# Patient Record
Sex: Female | Born: 1970 | Race: White | Hispanic: No | Smoking: Former smoker
Health system: Southern US, Community
[De-identification: ages and names within clinical notes are randomized; demographics above are authoritative.]

## PROBLEM LIST (undated history)

## (undated) DIAGNOSIS — F32A Depression, unspecified: Secondary | ICD-10-CM

## (undated) DIAGNOSIS — R87620 Atypical squamous cells of undetermined significance on cytologic smear of vagina (ASC-US): Secondary | ICD-10-CM

## (undated) DIAGNOSIS — F329 Major depressive disorder, single episode, unspecified: Secondary | ICD-10-CM

## (undated) DIAGNOSIS — E785 Hyperlipidemia, unspecified: Secondary | ICD-10-CM

## (undated) HISTORY — PX: APPENDECTOMY: SHX54

## (undated) HISTORY — DX: Atypical squamous cells of undetermined significance on cytologic smear of vagina (ASC-US): R87.620

## (undated) HISTORY — DX: Hyperlipidemia, unspecified: E78.5

---
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
## 2001-04-25 ENCOUNTER — Encounter: Payer: Self-pay | Admitting: Internal Medicine

## 2001-04-25 ENCOUNTER — Ambulatory Visit (HOSPITAL_COMMUNITY): Admission: RE | Admit: 2001-04-25 | Discharge: 2001-04-25 | Payer: Self-pay | Admitting: Internal Medicine

## 2010-02-21 ENCOUNTER — Ambulatory Visit (HOSPITAL_COMMUNITY): Admission: RE | Admit: 2010-02-21 | Discharge: 2010-02-21 | Payer: Self-pay | Admitting: Family Medicine

## 2010-02-28 ENCOUNTER — Encounter (INDEPENDENT_AMBULATORY_CARE_PROVIDER_SITE_OTHER): Payer: Self-pay | Admitting: General Surgery

## 2010-02-28 ENCOUNTER — Ambulatory Visit (HOSPITAL_COMMUNITY)
Admission: RE | Admit: 2010-02-28 | Discharge: 2010-03-02 | Payer: Self-pay | Source: Home / Self Care | Admitting: General Surgery

## 2010-06-24 LAB — DIFFERENTIAL
Basophils Absolute: 0 10*3/uL (ref 0.0–0.1)
Basophils Relative: 0 % (ref 0–1)
Eosinophils Absolute: 0.1 10*3/uL (ref 0.0–0.7)
Eosinophils Relative: 1 % (ref 0–5)
Lymphocytes Relative: 22 % (ref 12–46)
Lymphs Abs: 1.8 10*3/uL (ref 0.7–4.0)
Monocytes Absolute: 0.5 10*3/uL (ref 0.1–1.0)
Monocytes Relative: 6 % (ref 3–12)
Neutro Abs: 6 10*3/uL (ref 1.7–7.7)
Neutrophils Relative %: 72 % (ref 43–77)

## 2010-06-24 LAB — COMPREHENSIVE METABOLIC PANEL
ALT: 16 U/L (ref 0–35)
AST: 19 U/L (ref 0–37)
Albumin: 3.6 g/dL (ref 3.5–5.2)
Alkaline Phosphatase: 55 U/L (ref 39–117)
BUN: 10 mg/dL (ref 6–23)
CO2: 27 mEq/L (ref 19–32)
Calcium: 9.1 mg/dL (ref 8.4–10.5)
Chloride: 104 mEq/L (ref 96–112)
Creatinine, Ser: 0.75 mg/dL (ref 0.4–1.2)
GFR calc Af Amer: >60 mL/min (ref >60)
GFR calc non Af Amer: >60 mL/min (ref >60)
Glucose, Bld: 102 mg/dL — ABNORMAL HIGH (ref 70–99)
Potassium: 3.7 mEq/L (ref 3.5–5.1)
Sodium: 136 mEq/L (ref 135–145)
Total Bilirubin: 0.5 mg/dL (ref 0.3–1.2)
Total Protein: 6.4 g/dL (ref 6.0–8.3)

## 2010-06-24 LAB — CBC
HCT: 34.4 % — ABNORMAL LOW (ref 36.0–46.0)
Hemoglobin: 11.7 g/dL — ABNORMAL LOW (ref 12.0–15.0)
MCH: 29.1 pg (ref 26.0–34.0)
MCHC: 34.1 g/dL (ref 30.0–36.0)
MCV: 85.6 fL (ref 78.0–100.0)
Platelets: 308 10*3/uL (ref 150–400)
RBC: 4.02 MIL/uL (ref 3.87–5.11)
RDW: 12.9 % (ref 11.5–15.5)
WBC: 8.4 10*3/uL (ref 4.0–10.5)

## 2010-06-24 LAB — SURGICAL PCR SCREEN
MRSA, PCR: NEGATIVE
Staphylococcus aureus: NEGATIVE

## 2010-06-24 LAB — HCG, QUANTITATIVE, PREGNANCY: hCG, Beta Chain, Quant, S: <2 m[IU]/mL (ref <5)

## 2011-01-21 ENCOUNTER — Other Ambulatory Visit (HOSPITAL_COMMUNITY): Payer: Self-pay | Admitting: Family Medicine

## 2011-01-21 DIAGNOSIS — Z139 Encounter for screening, unspecified: Secondary | ICD-10-CM

## 2011-02-23 ENCOUNTER — Ambulatory Visit (HOSPITAL_COMMUNITY)
Admission: RE | Admit: 2011-02-23 | Discharge: 2011-02-23 | Disposition: A | Discharge status: Home / Self Care | Payer: BC Managed Care – PPO | Source: Ambulatory Visit | Attending: Family Medicine | Admitting: Family Medicine

## 2011-02-23 DIAGNOSIS — Z139 Encounter for screening, unspecified: Secondary | ICD-10-CM

## 2011-02-23 DIAGNOSIS — Z1231 Encounter for screening mammogram for malignant neoplasm of breast: Secondary | ICD-10-CM | POA: Insufficient documentation

## 2011-04-14 HISTORY — PX: ANAL FISTULOTOMY: SHX6423

## 2012-01-25 ENCOUNTER — Other Ambulatory Visit (HOSPITAL_COMMUNITY): Payer: Self-pay | Admitting: Family Medicine

## 2012-01-25 DIAGNOSIS — Z139 Encounter for screening, unspecified: Secondary | ICD-10-CM

## 2012-03-11 ENCOUNTER — Ambulatory Visit (HOSPITAL_COMMUNITY): Payer: BC Managed Care – PPO

## 2012-03-14 ENCOUNTER — Ambulatory Visit (HOSPITAL_COMMUNITY)
Admission: RE | Admit: 2012-03-14 | Discharge: 2012-03-14 | Disposition: A | Discharge status: Home / Self Care | Payer: BC Managed Care – PPO | Source: Ambulatory Visit | Attending: Family Medicine | Admitting: Family Medicine

## 2012-03-14 DIAGNOSIS — Z139 Encounter for screening, unspecified: Secondary | ICD-10-CM

## 2012-03-14 DIAGNOSIS — Z1231 Encounter for screening mammogram for malignant neoplasm of breast: Secondary | ICD-10-CM | POA: Insufficient documentation

## 2013-02-07 ENCOUNTER — Other Ambulatory Visit (HOSPITAL_COMMUNITY): Payer: Self-pay | Admitting: Family Medicine

## 2013-02-07 DIAGNOSIS — Z139 Encounter for screening, unspecified: Secondary | ICD-10-CM

## 2013-03-20 ENCOUNTER — Ambulatory Visit (HOSPITAL_COMMUNITY)
Admission: RE | Admit: 2013-03-20 | Discharge: 2013-03-20 | Disposition: A | Discharge status: Home / Self Care | Payer: BC Managed Care – PPO | Source: Ambulatory Visit | Attending: Family Medicine | Admitting: Family Medicine

## 2013-03-20 DIAGNOSIS — Z139 Encounter for screening, unspecified: Secondary | ICD-10-CM

## 2013-03-20 DIAGNOSIS — Z1231 Encounter for screening mammogram for malignant neoplasm of breast: Secondary | ICD-10-CM | POA: Insufficient documentation

## 2013-06-24 LAB — OB RESULTS CONSOLE HGB/HCT, BLOOD
HEMATOCRIT: 38 %
Hemoglobin: 13.4 g/dL

## 2013-06-25 LAB — OB RESULTS CONSOLE PLATELET COUNT: Platelets: 367 10*3/uL

## 2013-07-13 ENCOUNTER — Encounter: Payer: Self-pay | Admitting: *Deleted

## 2013-07-18 ENCOUNTER — Ambulatory Visit (INDEPENDENT_AMBULATORY_CARE_PROVIDER_SITE_OTHER): Payer: BC Managed Care – PPO | Admitting: Obstetrics & Gynecology

## 2013-07-18 ENCOUNTER — Other Ambulatory Visit (HOSPITAL_COMMUNITY)
Admission: RE | Admit: 2013-07-18 | Discharge: 2013-07-18 | Disposition: A | Discharge status: Home / Self Care | Payer: BC Managed Care – PPO | Source: Ambulatory Visit | Attending: Obstetrics & Gynecology | Admitting: Obstetrics & Gynecology

## 2013-07-18 ENCOUNTER — Encounter: Payer: Self-pay | Admitting: Obstetrics & Gynecology

## 2013-07-18 VITALS — BP 120/70 | Ht 62.2 in | Wt 150.0 lb

## 2013-07-18 DIAGNOSIS — Z1151 Encounter for screening for human papillomavirus (HPV): Secondary | ICD-10-CM | POA: Insufficient documentation

## 2013-07-18 DIAGNOSIS — R87619 Unspecified abnormal cytological findings in specimens from cervix uteri: Secondary | ICD-10-CM

## 2013-07-18 MED ORDER — DESOGESTREL-ETHINYL ESTRADIOL 0.15-0.02/0.01 MG (21/5) PO TABS: 1.0000 | ORAL_TABLET | Freq: Every day | ORAL | Status: DC

## 2013-07-18 NOTE — Progress Notes (Signed)
Patient ID: Desiree Chen, female   DOB: 05/03/1970, 43 y.o.   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 Hyperlipidemia   . 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 No pertinent past surgical history.  OB History   Grav Para Term Preterm Abortions TAB SAB Ect Mult Living                  Allergies  Allergen Reactions  . Sulfa Antibiotics Itching    History   Social History  . Marital Status: Legally Separated    Spouse Name: N/A    Number of Children: N/A  . Years of Education: N/A   Social History Main Topics  . Smoking status: Former Games developermoker  . Smokeless tobacco: None  . Alcohol Use: None  . Drug Use: None  . Sexual Activity: None   Other Topics Concern  . None   Social History Narrative  . None    History reviewed. No pertinent family history.

## 2013-07-24 LAB — CERVICOVAGINAL ANCILLARY ONLY: HPV: NOT DETECTED

## 2013-09-05 ENCOUNTER — Telehealth: Payer: Self-pay | Admitting: Obstetrics & Gynecology

## 2013-09-05 MED ORDER — NORGESTIMATE-ETH ESTRADIOL 0.25-35 MG-MCG PO TABS: 1.0000 | ORAL_TABLET | Freq: Every day | ORAL | Status: DC

## 2013-09-05 NOTE — Telephone Encounter (Signed)
Pt states does not like the Kariva birth control requesting Dr. Despina Hidden to change.

## 2013-10-16 ENCOUNTER — Ambulatory Visit (INDEPENDENT_AMBULATORY_CARE_PROVIDER_SITE_OTHER): Payer: BC Managed Care – PPO | Admitting: Obstetrics & Gynecology

## 2013-10-16 ENCOUNTER — Encounter: Payer: Self-pay | Admitting: Obstetrics & Gynecology

## 2013-10-16 VITALS — BP 110/80 | Ht 62.0 in | Wt 152.0 lb

## 2013-10-16 DIAGNOSIS — N920 Excessive and frequent menstruation with regular cycle: Secondary | ICD-10-CM | POA: Insufficient documentation

## 2013-10-16 MED ORDER — MEGESTROL ACETATE 40 MG PO TABS: 40.0000 mg | ORAL_TABLET | Freq: Every day | ORAL | Status: DC

## 2013-10-16 NOTE — Progress Notes (Signed)
Patient ID: Desiree Chen, female   DOB: 12/11/1970, 43 y.o.   MRN: [id number] Blood pressure 110/80, height 5\' 2"  (1.575 m), weight 152 lb (68.947 kg), last menstrual period 10/02/2013.  Pt doing poorly on her OCP Will try megestrol then do sonogram and evaluate for endometrial ablation  Follow u[p 1 month

## 2013-11-14 ENCOUNTER — Telehealth: Payer: Self-pay | Admitting: Obstetrics & Gynecology

## 2013-11-14 NOTE — Telephone Encounter (Signed)
Pt states had light vaginal bleeding starting on 11/09/13 been taking the megace TID since 11/10/13 with no improvement. Pt does have an appt 11/27/2013 for an ultrasound. Please advise.

## 2013-11-22 NOTE — Telephone Encounter (Signed)
Just keep appt

## 2013-11-24 ENCOUNTER — Ambulatory Visit: Payer: BC Managed Care – PPO | Admitting: Obstetrics & Gynecology

## 2013-11-24 ENCOUNTER — Other Ambulatory Visit: Payer: BC Managed Care – PPO

## 2013-11-27 ENCOUNTER — Ambulatory Visit (INDEPENDENT_AMBULATORY_CARE_PROVIDER_SITE_OTHER): Payer: BC Managed Care – PPO | Admitting: Obstetrics & Gynecology

## 2013-11-27 ENCOUNTER — Encounter (HOSPITAL_COMMUNITY): Payer: Self-pay | Admitting: Pharmacy Technician

## 2013-11-27 ENCOUNTER — Ambulatory Visit (INDEPENDENT_AMBULATORY_CARE_PROVIDER_SITE_OTHER): Payer: BC Managed Care – PPO

## 2013-11-27 ENCOUNTER — Encounter: Payer: Self-pay | Admitting: Radiology

## 2013-11-27 VITALS — BP 120/80 | Ht 62.2 in | Wt 151.0 lb

## 2013-11-27 DIAGNOSIS — N920 Excessive and frequent menstruation with regular cycle: Secondary | ICD-10-CM

## 2013-11-27 DIAGNOSIS — N946 Dysmenorrhea, unspecified: Secondary | ICD-10-CM

## 2013-11-27 NOTE — Progress Notes (Signed)
Patient ID: Desiree Chen, female   DOB: 04-Dec-1970, 43 y.o.   MRN: [id number] US Pelvis Complete  11/27/2013   GYNECOLOGIC SONOGRAM   Desiree Chen is a 43 y.o. [id number] LMP 11/09/2013 for a pelvic sonogram  for menorrhagia.  Uterus                      8.5 x 5.2 x 3.9 cm, anteverted   Endometrium          4.0 mm, symmetrical,   Right ovary             2.2 x 1.7 x 1.5 cm,   Left ovary                1.9 x 1.6 x 1.1 cm,   No free fluid or adnexal masses noted within the pelvis  Technician Comments:  Anteverted uterus, Endometrium=4.55mm, bilateral adnexa/ovaries appear WNL,  no free fluid or adnexal masses noted within the pelvis   Chari Manning 11/27/2013 9:32 AM   Clinical Impression and recommendations:  I have reviewed the sonogram results above, combined with the patient's  current clinical course, below are my impressions and any appropriate  recommendations for management based on the sonographic findings.  Normal gyn anatomy, no anatomical source for patient's symptoms/problems  Pius Byrom H 11/27/2013 9:59 AM   Preoperative History and Physical  Desiree Chen is a 43 y.o. [id number] with Patient's last menstrual period was 11/09/2013. admitted for a hysteroscopy uterine curettage endometrial ablation.  10 year history of heavy painful periods, not managable any longer with OCP.  Sonogram is normal Responded to megace with lighter period.  Will use low dose OCP for BCM and hormonal control  PMH:    Past Medical History  Diagnosis Date  . Hyperlipidemia   . 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 No pertinent past surgical history.  POb/GynH:      OB History   Grav Para Term Preterm Abortions TAB SAB Ect Mult Living   3 2 2  0 1 0 1 0 0 2      SH:   History  Substance Use Topics  . Smoking status: Former Games developer  . Smokeless tobacco: Not on file  . Alcohol Use: Not on file    FH:    Family History  Problem Relation Age of Onset  . Cancer Father    . Stroke Mother      Allergies:  Allergies  Allergen Reactions  . Sulfa Antibiotics Itching    Medications:      Current outpatient prescriptions:azithromycin (ZITHROMAX) 1 G powder, Take 1 g by mouth once., Disp: , Rfl: ;  desogestrel-ethinyl estradiol (KARIVA) 0.15-0.02/0.01 MG (21/5) tablet, Take 1 tablet by mouth daily., Disp: 1 Package, Rfl: 11;  megestrol (MEGACE) 40 MG tablet, Take 1 tablet (40 mg total) by mouth daily., Disp: 30 tablet, Rfl: 11;  Norethindrone-Mestranol (ORTHO-NOVUM 1/50, 28, PO), Take 1 tablet by mouth daily., Disp: , Rfl:  norgestimate-ethinyl estradiol (ORTHO-CYCLEN, 28,) 0.25-35 MG-MCG tablet, Take 1 tablet by mouth daily., Disp: 1 Package, Rfl: 11  Review of Systems:   Review of Systems  Constitutional: Negative for fever, chills, weight loss, malaise/fatigue and diaphoresis.  HENT: Negative for hearing loss, ear pain, nosebleeds, congestion, sore throat, neck pain, tinnitus and ear discharge.   Eyes: Negative for blurred vision, double vision, photophobia, pain, discharge and redness.  Respiratory: Negative for cough, hemoptysis, sputum  production, shortness of breath, wheezing and stridor.   Cardiovascular: Negative for chest pain, palpitations, orthopnea, claudication, leg swelling and PND.  Gastrointestinal: Positive for abdominal pain. Negative for heartburn, nausea, vomiting, diarrhea, constipation, blood in stool and melena.  Genitourinary: Negative for dysuria, urgency, frequency, hematuria and flank pain.  Musculoskeletal: Negative for myalgias, back pain, joint pain and falls.  Skin: Negative for itching and rash.  Neurological: Negative for dizziness, tingling, tremors, sensory change, speech change, focal weakness, seizures, loss of consciousness, weakness and headaches.  Endo/Heme/Allergies: Negative for environmental allergies and polydipsia. Does not bruise/bleed easily.  Psychiatric/Behavioral: Negative for depression, suicidal ideas,  hallucinations, memory loss and substance abuse. The patient is not nervous/anxious and does not have insomnia.      PHYSICAL EXAM:  Blood pressure 120/80, height 5' 2.2" (1.58 m), weight 151 lb (68.493 kg), last menstrual period 11/09/2013.    Vitals reviewed. Constitutional: She is oriented to person, place, and time. She appears well-developed and well-nourished.  HENT:  Head: Normocephalic and atraumatic.  Right Ear: External ear normal.  Left Ear: External ear normal.  Nose: Nose normal.  Mouth/Throat: Oropharynx is clear and moist.  Eyes: Conjunctivae and EOM are normal. Pupils are equal, round, and reactive to light. Right eye exhibits no discharge. Left eye exhibits no discharge. No scleral icterus.  Neck: Normal range of motion. Neck supple. No tracheal deviation present. No thyromegaly present.  Cardiovascular: Normal rate, regular rhythm, normal heart sounds and intact distal pulses.  Exam reveals no gallop and no friction rub.   No murmur heard. Respiratory: Effort normal and breath sounds normal. No respiratory distress. She has no wheezes. She has no rales. She exhibits no tenderness.  GI: Soft. Bowel sounds are normal. She exhibits no distension and no mass. There is tenderness. There is no rebound and no guarding.  Genitourinary:       Vulva is normal without lesions Vagina is pink moist without discharge Cervix normal in appearance and pap is normal Uterus is normal Adnexa is negative with normal sized ovaries by sonogram  Musculoskeletal: Normal range of motion. She exhibits no edema and no tenderness.  Neurological: She is alert and oriented to person, place, and time. She has normal reflexes. She displays normal reflexes. No cranial nerve deficit. She exhibits normal muscle tone. Coordination normal.  Skin: Skin is warm and dry. No rash noted. No erythema. No pallor.  Psychiatric: She has a normal mood and affect. Her behavior is normal. Judgment and thought  content normal.    Labs: No results found for this or any previous visit (from the past 336 hour(s)).  EKG: No orders found for this or any previous visit.  Imaging Studies: US Pelvis Complete  11/27/2013   GYNECOLOGIC SONOGRAM   Desiree Chen is a 43 y.o. [id number] LMP 11/09/2013 for a pelvic sonogram  for menorrhagia.  Uterus                      8.5 x 5.2 x 3.9 cm, anteverted   Endometrium          4.0 mm, symmetrical,   Right ovary             2.2 x 1.7 x 1.5 cm,   Left ovary                1.9 x 1.6 x 1.1 cm,   No free fluid or adnexal masses noted within the pelvis  Technician Comments:  Anteverted uterus,  Endometrium=4.770mm, bilateral adnexa/ovaries appear WNL,  no free fluid or adnexal masses noted within the pelvis   Chari ManningMcBride, Tasha 11/27/2013 9:32 AM   Clinical Impression and recommendations:  I have reviewed the sonogram results above, combined with the patient's  current clinical course, below are my impressions and any appropriate  recommendations for management based on the sonographic findings.  Normal gyn anatomy, no anatomical source for patient's symptoms/problems  Vern Guerette H 11/27/2013 9:59 AM      Assessment: Patient Active Problem List   Diagnosis Date Noted  . Menorrhagia 10/16/2013    Plan: Hysterosocopy uterine curettage endo ablation  Shalon Salado H 11/27/2013 10:15 AM

## 2013-12-07 NOTE — Patient Instructions (Signed)
Your procedure is scheduled on: 12/14/2013  Report to [facility] at   11:00  AM.  Call this number if you have problems the morning of surgery: [phone]   Remember:   Do not drink or eat food:After Midnight.  :     Do not wear jewelry, make-up or nail polish.  Do not wear lotions, powders, or perfumes. You may wear deodorant.  Do not shave 48 hours prior to surgery. Men may shave face and neck.  Do not bring valuables to the hospital.  Contacts, dentures or bridgework may not be worn into surgery.  Leave suitcase in the car. After surgery it may be brought to your room.  For patients admitted to the hospital, checkout time is 11:00 AM the day of discharge.   Patients discharged the day of surgery will not be allowed to drive home.    Special Instructions: Shower using CHG night before surgery and shower the day of surgery use CHG.  Use special wash - you have one bottle of CHG for all showers.  You should use approximately 1/2 of the bottle for each shower.   Please read over the following fact sheets that you were given: Pain Booklet, MRSA Information, Surgical Site Infection Prevention and Care and Recovery After Surgery  Endometrial Ablation Endometrial ablation removes the lining of the uterus (endometrium). It is usually a same-day, outpatient treatment. Ablation helps avoid major surgery, such as surgery to remove the cervix and uterus (hysterectomy). After endometrial ablation, you will have little or no menstrual bleeding and may not be able to have children. However, if you are premenopausal, you will need to use a reliable method of birth control following the procedure because of the small chance that pregnancy can occur. There are different reasons to have this procedure, which include:  Heavy periods.  Bleeding that is causing anemia.  Irregular bleeding.  Bleeding fibroids on the lining inside the uterus if they are smaller than 3 centimeters. This procedure should  not be done if:  You want children in the future.  You have severe cramps with your menstrual period.  You have precancerous or cancerous cells in your uterus.  You were recently pregnant.  You have gone through menopause.  You have had major surgery on the uterus, such as a cesarean delivery. LET [facility] CARE PROVIDER KNOW ABOUT:  Any allergies you have.  All medicines you are taking, including vitamins, herbs, eye drops, creams, and over-the-counter medicines.  Previous problems you or members of your family have had with the use of anesthetics.  Any blood disorders you have.  Previous surgeries you have had.  Medical conditions you have. RISKS AND COMPLICATIONS  Generally, this is a safe procedure. However, as with any procedure, complications can occur. Possible complications include:  Perforation of the uterus.  Bleeding.  Infection of the uterus, bladder, or vagina.  Injury to surrounding organs.  An air bubble to the lung (air embolus).  Pregnancy following the procedure.  Failure of the procedure to help the problem, requiring hysterectomy.  Decreased ability to diagnose cancer in the lining of the uterus. BEFORE THE PROCEDURE  The lining of the uterus must be tested to make sure there is no pre-cancerous or cancer cells present.  An ultrasound may be performed to look at the size of the uterus and to check for abnormalities.  Medicines may be given to thin the lining of the uterus. PROCEDURE  During the procedure, your health care provider  will use a tool called a resectoscope to help see inside your uterus. There are different ways to remove the lining of your uterus.   Radiofrequency - This method uses a radiofrequency-alternating electric current to remove the lining of the uterus.  Cryotherapy - This method uses extreme cold to freeze the lining of the uterus.  Heated-Free Liquid - This method uses heated salt (saline) solution to remove the  lining of the uterus.  Microwave - This method uses high-energy microwaves to heat up the lining of the uterus to remove it.  Thermal balloon - This method involves inserting a catheter with a balloon tip into the uterus. The balloon tip is filled with heated fluid to remove the lining of the uterus. AFTER THE PROCEDURE  After your procedure, do not have sexual intercourse or insert anything into your vagina until permitted by your health care provider. After the procedure, you may experience:  Cramps.  Vaginal discharge.  Frequent urination. Document Released: 02/07/2004 Document Revised: 11/30/2012 Document Reviewed: 08/31/2012 [facility] Patient Information 2015 [place], [place]. This information is not intended to replace advice given to you by your health care provider. Make sure you discuss any questions you have with your health care provider. General Anesthesia, Care After Refer to this sheet in the next few weeks. These instructions provide you with information on caring for yourself after your procedure. Your health care provider may also give you more specific instructions. Your treatment has been planned according to current medical practices, but problems sometimes occur. Call your health care provider if you have any problems or questions after your procedure. WHAT TO EXPECT AFTER THE PROCEDURE After the procedure, it is typical to experience:  Sleepiness.  Nausea and vomiting. HOME CARE INSTRUCTIONS  For the first 24 hours after general anesthesia:  Have a responsible person with you.  Do not drive a car. If you are alone, do not take public transportation.  Do not drink alcohol.  Do not take medicine that has not been prescribed by your health care provider.  Do not sign important papers or make important decisions.  You may resume a normal diet and activities as directed by your health care provider.  Change bandages (dressings) as directed.  If you have  questions or problems that seem related to general anesthesia, call the hospital and ask for the anesthetist or anesthesiologist on call. SEEK MEDICAL CARE IF:  You have nausea and vomiting that continue the day after anesthesia.  You develop a rash. SEEK IMMEDIATE MEDICAL CARE IF:   You have difficulty breathing.  You have chest pain.  You have any allergic problems. Document Released: 07/06/2000 Document Revised: 04/04/2013 Document Reviewed: 10/13/2012 [facility] Patient Information 2015 [place], [place]. This information is not intended to replace advice given to you by your health care provider. Make sure you discuss any questions you have with your health care provider.

## 2013-12-08 ENCOUNTER — Encounter (HOSPITAL_COMMUNITY)
Admission: RE | Admit: 2013-12-08 | Discharge: 2013-12-08 | Disposition: A | Discharge status: Home / Self Care | Payer: BC Managed Care – PPO | Source: Ambulatory Visit | Attending: Obstetrics & Gynecology | Admitting: Obstetrics & Gynecology

## 2013-12-08 ENCOUNTER — Encounter (HOSPITAL_COMMUNITY): Payer: Self-pay

## 2013-12-08 DIAGNOSIS — Z01818 Encounter for other preprocedural examination: Secondary | ICD-10-CM | POA: Diagnosis present

## 2013-12-08 DIAGNOSIS — N920 Excessive and frequent menstruation with regular cycle: Secondary | ICD-10-CM | POA: Diagnosis present

## 2013-12-08 DIAGNOSIS — N946 Dysmenorrhea, unspecified: Secondary | ICD-10-CM | POA: Diagnosis present

## 2013-12-08 LAB — URINALYSIS, ROUTINE W REFLEX MICROSCOPIC
Bilirubin Urine: NEGATIVE
GLUCOSE, UA: NEGATIVE mg/dL
Ketones, ur: NEGATIVE mg/dL
LEUKOCYTES UA: NEGATIVE
Nitrite: NEGATIVE
PH: 5.5 (ref 5.0–8.0)
Protein, ur: NEGATIVE mg/dL
Specific Gravity, Urine: 1.025 (ref 1.005–1.030)
Urobilinogen, UA: 0.2 mg/dL (ref 0.0–1.0)

## 2013-12-08 LAB — CBC
HCT: 40.3 % (ref 36.0–46.0)
HEMOGLOBIN: 13.8 g/dL (ref 12.0–15.0)
MCH: 29.9 pg (ref 26.0–34.0)
MCHC: 34.2 g/dL (ref 30.0–36.0)
MCV: 87.4 fL (ref 78.0–100.0)
PLATELETS: 329 10*3/uL (ref 150–400)
RBC: 4.61 MIL/uL (ref 3.87–5.11)
RDW: 12.8 % (ref 11.5–15.5)
WBC: 5.6 10*3/uL (ref 4.0–10.5)

## 2013-12-08 LAB — COMPREHENSIVE METABOLIC PANEL
ALT: 25 U/L (ref 0–35)
AST: 18 U/L (ref 0–37)
Albumin: 4 g/dL (ref 3.5–5.2)
Alkaline Phosphatase: 65 U/L (ref 39–117)
Anion gap: 12 (ref 5–15)
BILIRUBIN TOTAL: 0.4 mg/dL (ref 0.3–1.2)
BUN: 11 mg/dL (ref 6–23)
CHLORIDE: 104 meq/L (ref 96–112)
CO2: 24 meq/L (ref 19–32)
CREATININE: 0.89 mg/dL (ref 0.50–1.10)
Calcium: 9.7 mg/dL (ref 8.4–10.5)
GFR calc Af Amer: >90 mL/min (ref >90)
GFR, EST NON AFRICAN AMERICAN: 78 mL/min — AB (ref >90)
Glucose, Bld: 87 mg/dL (ref 70–99)
Potassium: 4.4 mEq/L (ref 3.7–5.3)
SODIUM: 140 meq/L (ref 137–147)
Total Protein: 7.4 g/dL (ref 6.0–8.3)

## 2013-12-08 LAB — TYPE AND SCREEN
ABO/RH(D): O POS
Antibody Screen: NEGATIVE

## 2013-12-08 LAB — URINE MICROSCOPIC-ADD ON

## 2013-12-08 LAB — HCG, QUANTITATIVE, PREGNANCY: hCG, Beta Chain, Quant, S: <1 m[IU]/mL (ref <5)

## 2013-12-08 NOTE — Pre-Procedure Instructions (Signed)
Discussed My Chart options and instructions provided.  Verbalized understanding.

## 2013-12-14 ENCOUNTER — Encounter (HOSPITAL_COMMUNITY)
Admission: RE | Disposition: A | Discharge status: Home / Self Care | Payer: Self-pay | Source: Ambulatory Visit | Attending: Obstetrics & Gynecology

## 2013-12-14 ENCOUNTER — Ambulatory Visit (HOSPITAL_COMMUNITY): Payer: BC Managed Care – PPO | Admitting: Anesthesiology

## 2013-12-14 ENCOUNTER — Encounter (HOSPITAL_COMMUNITY): Payer: BC Managed Care – PPO | Admitting: Anesthesiology

## 2013-12-14 ENCOUNTER — Encounter (HOSPITAL_COMMUNITY): Payer: Self-pay

## 2013-12-14 ENCOUNTER — Ambulatory Visit (HOSPITAL_COMMUNITY)
Admission: RE | Admit: 2013-12-14 | Discharge: 2013-12-15 | Disposition: A | Discharge status: Home / Self Care | Payer: BC Managed Care – PPO | Source: Ambulatory Visit | Attending: Obstetrics & Gynecology | Admitting: Obstetrics & Gynecology

## 2013-12-14 DIAGNOSIS — Z87891 Personal history of nicotine dependence: Secondary | ICD-10-CM | POA: Insufficient documentation

## 2013-12-14 DIAGNOSIS — N946 Dysmenorrhea, unspecified: Secondary | ICD-10-CM | POA: Insufficient documentation

## 2013-12-14 DIAGNOSIS — N920 Excessive and frequent menstruation with regular cycle: Secondary | ICD-10-CM | POA: Insufficient documentation

## 2013-12-14 DIAGNOSIS — N840 Polyp of corpus uteri: Secondary | ICD-10-CM

## 2013-12-14 HISTORY — PX: DILITATION & CURRETTAGE/HYSTROSCOPY WITH THERMACHOICE ABLATION: SHX5569

## 2013-12-14 SURGERY — DILATATION & CURETTAGE/HYSTEROSCOPY WITH THERMACHOICE ABLATION
Anesthesia: General

## 2013-12-14 MED ORDER — CEFAZOLIN SODIUM-DEXTROSE 2-3 GM-% IV SOLR
INTRAVENOUS | Status: AC
  Filled 2013-12-14: qty 50

## 2013-12-14 MED ORDER — ONDANSETRON HCL 4 MG/2ML IJ SOLN: 4.0000 mg | Freq: Once | INTRAMUSCULAR | Status: AC | PRN

## 2013-12-14 MED ORDER — PROPOFOL 10 MG/ML IV EMUL
INTRAVENOUS | Status: AC
  Filled 2013-12-14: qty 20

## 2013-12-14 MED ORDER — FENTANYL CITRATE 0.05 MG/ML IJ SOLN
INTRAMUSCULAR | Status: AC
  Filled 2013-12-14: qty 2

## 2013-12-14 MED ORDER — HYDROCODONE-ACETAMINOPHEN 5-325 MG PO TABS: 1.0000 | ORAL_TABLET | Freq: Four times a day (QID) | ORAL | Status: DC | PRN

## 2013-12-14 MED ORDER — KETOROLAC TROMETHAMINE 30 MG/ML IJ SOLN
INTRAMUSCULAR | Status: AC
  Filled 2013-12-14: qty 1

## 2013-12-14 MED ORDER — LIDOCAINE HCL (CARDIAC) 20 MG/ML IV SOLN
INTRAVENOUS | Status: DC | PRN
  Administered 2013-12-14: 50 mg via INTRAVENOUS

## 2013-12-14 MED ORDER — FENTANYL CITRATE 0.05 MG/ML IJ SOLN
INTRAMUSCULAR | Status: AC
  Filled 2013-12-14: qty 5

## 2013-12-14 MED ORDER — ONDANSETRON HCL 4 MG/2ML IJ SOLN
INTRAMUSCULAR | Status: AC
  Filled 2013-12-14: qty 2

## 2013-12-14 MED ORDER — ONDANSETRON HCL 4 MG/2ML IJ SOLN
4.0000 mg | Freq: Once | INTRAMUSCULAR | Status: AC
  Administered 2013-12-14: 4 mg via INTRAVENOUS

## 2013-12-14 MED ORDER — DEXAMETHASONE SODIUM PHOSPHATE 4 MG/ML IJ SOLN
4.0000 mg | Freq: Once | INTRAMUSCULAR | Status: AC
  Administered 2013-12-14: 4 mg via INTRAVENOUS

## 2013-12-14 MED ORDER — DEXTROSE 5 % IV SOLN
INTRAVENOUS | Status: DC | PRN
Start: 2013-12-14 — End: 2013-12-14
  Administered 2013-12-14: 500 mL

## 2013-12-14 MED ORDER — ONDANSETRON HCL 8 MG PO TABS: 8.0000 mg | ORAL_TABLET | Freq: Three times a day (TID) | ORAL | Status: DC | PRN

## 2013-12-14 MED ORDER — KETOROLAC TROMETHAMINE 30 MG/ML IJ SOLN
30.0000 mg | Freq: Once | INTRAMUSCULAR | Status: AC
  Administered 2013-12-14: 30 mg via INTRAVENOUS

## 2013-12-14 MED ORDER — MIDAZOLAM HCL 2 MG/2ML IJ SOLN
INTRAMUSCULAR | Status: AC
  Filled 2013-12-14: qty 2

## 2013-12-14 MED ORDER — FENTANYL CITRATE 0.05 MG/ML IJ SOLN
INTRAMUSCULAR | Status: DC | PRN
  Administered 2013-12-14 (×3): 25 ug via INTRAVENOUS
  Administered 2013-12-14 (×3): 50 ug via INTRAVENOUS
  Administered 2013-12-14: 25 ug via INTRAVENOUS

## 2013-12-14 MED ORDER — MIDAZOLAM HCL 2 MG/2ML IJ SOLN
1.0000 mg | INTRAMUSCULAR | Status: DC | PRN
  Administered 2013-12-14 (×2): 2 mg via INTRAVENOUS
  Filled 2013-12-14: qty 2

## 2013-12-14 MED ORDER — FENTANYL CITRATE 0.05 MG/ML IJ SOLN
25.0000 ug | INTRAMUSCULAR | Status: AC
  Administered 2013-12-14 (×2): 25 ug via INTRAVENOUS

## 2013-12-14 MED ORDER — KETOROLAC TROMETHAMINE 10 MG PO TABS: 10.0000 mg | ORAL_TABLET | Freq: Three times a day (TID) | ORAL | Status: DC | PRN

## 2013-12-14 MED ORDER — PROPOFOL 10 MG/ML IV BOLUS
INTRAVENOUS | Status: DC | PRN
  Administered 2013-12-14: 140 mg via INTRAVENOUS

## 2013-12-14 MED ORDER — DEXAMETHASONE SODIUM PHOSPHATE 4 MG/ML IJ SOLN
INTRAMUSCULAR | Status: AC
  Filled 2013-12-14: qty 1

## 2013-12-14 MED ORDER — LIDOCAINE HCL (PF) 1 % IJ SOLN
INTRAMUSCULAR | Status: AC
  Filled 2013-12-14: qty 5

## 2013-12-14 MED ORDER — LACTATED RINGERS IV SOLN
INTRAVENOUS | Status: DC
  Administered 2013-12-14: 12:00:00 via INTRAVENOUS

## 2013-12-14 MED ORDER — LACTATED RINGERS IV SOLN
INTRAVENOUS | Status: DC | PRN
  Administered 2013-12-14 (×2): via INTRAVENOUS

## 2013-12-14 MED ORDER — FENTANYL CITRATE 0.05 MG/ML IJ SOLN
25.0000 ug | INTRAMUSCULAR | Status: DC | PRN
  Administered 2013-12-14 (×2): 50 ug via INTRAVENOUS

## 2013-12-14 MED ORDER — SODIUM CHLORIDE 0.9 % IR SOLN
Status: DC | PRN
  Administered 2013-12-14: 1000 mL

## 2013-12-14 MED ORDER — CEFAZOLIN SODIUM-DEXTROSE 2-3 GM-% IV SOLR
2.0000 g | INTRAVENOUS | Status: AC
  Administered 2013-12-14: 2 g via INTRAVENOUS

## 2013-12-14 SURGICAL SUPPLY — 35 items
BAG DECANTER FOR FLEXI CONT (MISCELLANEOUS) ×3 IMPLANT
BAG HAMPER (MISCELLANEOUS) ×3 IMPLANT
CATH THERMACHOICE III (CATHETERS) ×3 IMPLANT
CLOTH BEACON ORANGE TIMEOUT ST (SAFETY) ×3 IMPLANT
COVER LIGHT HANDLE STERIS (MISCELLANEOUS) ×6 IMPLANT
COVER MAYO STAND XLG (DRAPE) ×3 IMPLANT
ELECT BLADE 6 FLAT ULTRCLN (ELECTRODE) ×2 IMPLANT
FORMALIN 10 PREFIL 120ML (MISCELLANEOUS) ×3 IMPLANT
GAUZE SPONGE 4X4 16PLY XRAY LF (GAUZE/BANDAGES/DRESSINGS) ×3 IMPLANT
GLOVE BIOGEL PI IND STRL 7.0 (GLOVE) IMPLANT
GLOVE BIOGEL PI IND STRL 8 (GLOVE) ×1 IMPLANT
GLOVE BIOGEL PI INDICATOR 7.0 (GLOVE) ×2
GLOVE BIOGEL PI INDICATOR 8 (GLOVE) ×2
GLOVE ECLIPSE 6.5 STRL STRAW (GLOVE) ×2 IMPLANT
GLOVE ECLIPSE 8.0 STRL XLNG CF (GLOVE) ×3 IMPLANT
GLOVE EXAM NITRILE MD LF STRL (GLOVE) ×2 IMPLANT
GOWN STRL REUS W/TWL LRG LVL3 (GOWN DISPOSABLE) ×3 IMPLANT
GOWN STRL REUS W/TWL XL LVL3 (GOWN DISPOSABLE) ×3 IMPLANT
INST SET HYSTEROSCOPY (KITS) ×3 IMPLANT
IV D5W 500ML (IV SOLUTION) ×3 IMPLANT
IV NS 1000ML (IV SOLUTION) ×3
IV NS 1000ML BAXH (IV SOLUTION) ×1 IMPLANT
KIT ROOM TURNOVER AP CYSTO (KITS) ×3 IMPLANT
MANIFOLD NEPTUNE II (INSTRUMENTS) ×3 IMPLANT
MARKER SKIN DUAL TIP RULER LAB (MISCELLANEOUS) ×3 IMPLANT
NS IRRIG 1000ML POUR BTL (IV SOLUTION) ×3 IMPLANT
PACK BASIC III (CUSTOM PROCEDURE TRAY) ×3
PACK SRG BSC III STRL LF ECLPS (CUSTOM PROCEDURE TRAY) ×1 IMPLANT
PAD ARMBOARD 7.5X6 YLW CONV (MISCELLANEOUS) ×3 IMPLANT
PAD TELFA 3X4 1S STER (GAUZE/BANDAGES/DRESSINGS) ×3 IMPLANT
PENCIL HANDSWITCHING (ELECTRODE) ×2 IMPLANT
SET BASIN LINEN APH (SET/KITS/TRAYS/PACK) ×3 IMPLANT
SET IRRIG Y TYPE TUR BLADDER L (SET/KITS/TRAYS/PACK) ×3 IMPLANT
SHEET LAVH (DRAPES) ×3 IMPLANT
YANKAUER SUCT BULB TIP 10FT TU (MISCELLANEOUS) ×3 IMPLANT

## 2013-12-14 NOTE — H&P (Signed)
Desiree Chen  11/27/2013 GYNECOLOGIC SONOGRAM Desiree Chen is a 43 y.o. [id number] LMP 11/09/2013 for a pelvic sonogram for menorrhagia. Uterus 8.5 x 5.2 x 3.9 cm, anteverted Endometrium 4.0 mm, symmetrical, Right ovary 2.2 x 1.7 x 1.5 cm, Left ovary 1.9 x 1.6 x 1.1 cm, No free fluid or adnexal masses noted within the pelvis Technician Comments: Anteverted uterus, Endometrium=4.82mm, bilateral adnexa/ovaries appear WNL, no free fluid or adnexal masses noted within the pelvis Desiree Chen 11/27/2013 9:32 AM Clinical Impression and recommendations: I have reviewed the sonogram results above, combined with the patient's current clinical course, below are my impressions and any appropriate recommendations for management based on the sonographic findings. Normal gyn anatomy, no anatomical source for patient's symptoms/problems Desiree Chen 11/27/2013 9:59 AM  Preoperative History and Physical  Desiree Chen is a 43 y.o. [id number] with Patient's last menstrual period was 11/09/2013. admitted for a hysteroscopy uterine curettage endometrial ablation.  10 year history of heavy painful periods, not managable any longer with OCP. Sonogram is normal  Responded to megace with lighter period.  Will use low dose OCP for BCM and hormonal control  PMH:  Past Medical History   Diagnosis  Date   .  Hyperlipidemia    .  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 No pertinent past surgical history.  POb/GynH:  OB History    Grav  Para  Term  Preterm  Abortions  TAB  SAB  Ect  Mult  Living    0  1  0  1  0  0  2     SH:  History   Substance Use Topics   .  Smoking status:  Former Games developer   .  Smokeless tobacco:  Not on file   .  Alcohol Use:  Not on file   FH:  Family History   Problem  Relation  Age of Onset   .  Cancer  Father    .  Stroke  Mother    Allergies:  Allergies   Allergen  Reactions   .  Sulfa Antibiotics  Itching   Medications: Current outpatient  prescriptions:azithromycin (ZITHROMAX) 1 G powder, Take 1 g by mouth once., Disp: , Rfl: ; desogestrel-ethinyl estradiol (KARIVA) 0.15-0.02/0.01 MG (21/5) tablet, Take 1 tablet by mouth daily., Disp: 1 Package, Rfl: 11; megestrol (MEGACE) 40 MG tablet, Take 1 tablet (40 mg total) by mouth daily., Disp: 30 tablet, Rfl: 11; Norethindrone-Mestranol (ORTHO-NOVUM 1/50, 28, PO), Take 1 tablet by mouth daily., Disp: , Rfl:  norgestimate-ethinyl estradiol (ORTHO-CYCLEN, 28,) 0.25-35 MG-MCG tablet, Take 1 tablet by mouth daily., Disp: 1 Package, Rfl: 11  Review of Systems:  Review of Systems  Constitutional: Negative for fever, chills, weight loss, malaise/fatigue and diaphoresis.  HENT: Negative for hearing loss, ear pain, nosebleeds, congestion, sore throat, neck pain, tinnitus and ear discharge.  Eyes: Negative for blurred vision, double vision, photophobia, pain, discharge and redness.  Respiratory: Negative for cough, hemoptysis, sputum production, shortness of breath, wheezing and stridor.  Cardiovascular: Negative for chest pain, palpitations, orthopnea, claudication, leg swelling and PND.  Gastrointestinal: Positive for abdominal pain. Negative for heartburn, nausea, vomiting, diarrhea, constipation, blood in stool and melena.  Genitourinary: Negative for dysuria, urgency, frequency, hematuria and flank pain.  Musculoskeletal: Negative for myalgias, back pain, joint pain and falls.  Skin: Negative for itching and rash.  Neurological: Negative for dizziness, tingling, tremors, sensory change, speech change,  focal weakness, seizures, loss of consciousness, weakness and headaches.  Endo/Heme/Allergies: Negative for environmental allergies and polydipsia. Does not bruise/bleed easily.  Psychiatric/Behavioral: Negative for depression, suicidal ideas, hallucinations, memory loss and substance abuse. The patient is not nervous/anxious and does not have insomnia.  PHYSICAL EXAM:  Blood pressure 120/80,  height 5' 2.2" (1.58 m), weight 151 lb (68.493 kg), last menstrual period 11/09/2013.  Vitals reviewed.  Constitutional: She is oriented to person, place, and time. She appears well-developed and well-nourished.  HENT:  Head: Normocephalic and atraumatic.  Right Ear: External ear normal.  Left Ear: External ear normal.  Nose: Nose normal.  Mouth/Throat: Oropharynx is clear and moist.  Eyes: Conjunctivae and EOM are normal. Pupils are equal, round, and reactive to light. Right eye exhibits no discharge. Left eye exhibits no discharge. No scleral icterus.  Neck: Normal range of motion. Neck supple. No tracheal deviation present. No thyromegaly present.  Cardiovascular: Normal rate, regular rhythm, normal heart sounds and intact distal pulses. Exam reveals no gallop and no friction rub.  No murmur heard.  Respiratory: Effort normal and breath sounds normal. No respiratory distress. She has no wheezes. She has no rales. She exhibits no tenderness.  GI: Soft. Bowel sounds are normal. She exhibits no distension and no mass. There is tenderness. There is no rebound and no guarding.  Genitourinary:  Vulva is normal without lesions Vagina is pink moist without discharge Cervix normal in appearance and pap is normal Uterus is normal Adnexa is negative with normal sized ovaries by sonogram  Musculoskeletal: Normal range of motion. She exhibits no edema and no tenderness.  Neurological: She is alert and oriented to person, place, and time. She has normal reflexes. She displays normal reflexes. No cranial nerve deficit. She exhibits normal muscle tone. Coordination normal.  Skin: Skin is warm and dry. No rash noted. No erythema. No pallor.  Psychiatric: She has a normal mood and affect. Her behavior is normal. Judgment and thought content normal.  Labs:  No results found for this or any previous visit (from the past 336 hour(s)).  EKG:  No orders found for this or any previous visit.  Imaging  Studies:  Desiree Chen  11/27/2013 GYNECOLOGIC SONOGRAM Desiree Chen is a 43 y.o. [id number] LMP 11/09/2013 for a pelvic sonogram for menorrhagia. Uterus 8.5 x 5.2 x 3.9 cm, anteverted Endometrium 4.0 mm, symmetrical, Right ovary 2.2 x 1.7 x 1.5 cm, Left ovary 1.9 x 1.6 x 1.1 cm, No free fluid or adnexal masses noted within the pelvis Technician Comments: Anteverted uterus, Endometrium=4.26mm, bilateral adnexa/ovaries appear WNL, no free fluid or adnexal masses noted within the pelvis Desiree Chen 11/27/2013 9:32 AM Clinical Impression and recommendations: I have reviewed the sonogram results above, combined with the patient's current clinical course, below are my impressions and any appropriate recommendations for management based on the sonographic findings. Normal gyn anatomy, no anatomical source for patient's symptoms/problems Desiree Chen Chen 11/27/2013 9:59 AM  Assessment:  Patient Active Problem List    Diagnosis  Date Noted   .  Menorrhagia  10/16/2013   Plan:  Hysterosocopy uterine curettage endo ablation    Peace Noyes Chen 12/14/2013 12:31 PM

## 2013-12-14 NOTE — Anesthesia Postprocedure Evaluation (Signed)
  Anesthesia Post-op Note  Patient: Samona R Priddy  Procedure(s) Performed: Procedure(s): DILATATION & CURETTAGE/HYSTEROSCOPY WITH THERMACHOICE ABLATION; TOTAL ABLATION THERAPY TIME 8 MINUTES 47 SECONDS; 86-88 DEG C (N/A)  Patient Location: PACU  Anesthesia Type:General  Level of Consciousness: awake, alert , oriented and patient cooperative  Airway and Oxygen Therapy: Patient Spontanous Breathing and Patient connected to face mask oxygen  Post-op Pain: mild  Post-op Assessment: Post-op Vital signs reviewed, Patient's Cardiovascular Status Stable, Respiratory Function Stable, Patent Airway, No signs of Nausea or vomiting and Pain level controlled  Post-op Vital Signs: Reviewed and stable  Last Vitals:  Filed Vitals:   12/14/13 1333  BP: 117/51  Pulse: 97  Temp: 36.3 C  Resp: 10    Complications: No apparent anesthesia complications

## 2013-12-14 NOTE — Transfer of Care (Signed)
Immediate Anesthesia Transfer of Care Note  Patient: Sadiyah R Priddy  Procedure(s) Performed: Procedure(s): DILATATION & CURETTAGE/HYSTEROSCOPY WITH THERMACHOICE ABLATION; TOTAL ABLATION THERAPY TIME 8 MINUTES 47 SECONDS; 86-88 DEG C (N/A)  Patient Location: PACU  Anesthesia Type:General  Level of Consciousness: awake, alert , oriented and patient cooperative  Airway & Oxygen Therapy: Patient Spontanous Breathing and Patient connected to face mask oxygen  Post-op Assessment: Report given to PACU RN and Post -op Vital signs reviewed and stable  Post vital signs: Reviewed and stable  Complications: No apparent anesthesia complications

## 2013-12-14 NOTE — Anesthesia Procedure Notes (Signed)
Procedure Name: LMA Insertion Date/Time: 12/14/2013 12:46 PM Performed by: Pernell Dupre, Reniya Mcclees A Pre-anesthesia Checklist: Timeout performed, Patient identified, Emergency Drugs available, Suction available and Patient being monitored Patient Re-evaluated:Patient Re-evaluated prior to inductionOxygen Delivery Method: Circle system utilized Intubation Type: IV induction Ventilation: Mask ventilation without difficulty LMA: LMA inserted LMA Size: 4.0 Number of attempts: 1 Placement Confirmation: positive ETCO2 and breath sounds checked- equal and bilateral Tube secured with: Tape Dental Injury: Teeth and Oropharynx as per pre-operative assessment

## 2013-12-14 NOTE — Anesthesia Preprocedure Evaluation (Signed)
Anesthesia Evaluation  Patient identified by MRN, date of birth, ID band Patient awake    Reviewed: Allergy & Precautions, H&P , NPO status , Patient's Chart, lab work & pertinent test results  Airway Mallampati: II TM Distance: >3 FB     Dental  (+) Teeth Intact   Pulmonary former smoker,  breath sounds clear to auscultation        Cardiovascular negative cardio ROS  Rhythm:Regular Rate:Normal     Neuro/Psych    GI/Hepatic negative GI ROS,   Endo/Other    Renal/GU      Musculoskeletal   Abdominal   Peds  Hematology   Anesthesia Other Findings   Reproductive/Obstetrics                           Anesthesia Physical Anesthesia Plan  ASA: I  Anesthesia Plan: General   Post-op Pain Management:    Induction: Intravenous  Airway Management Planned: LMA  Additional Equipment:   Intra-op Plan:   Post-operative Plan: Extubation in OR  Informed Consent: I have reviewed the patients History and Physical, chart, labs and discussed the procedure including the risks, benefits and alternatives for the proposed anesthesia with the patient or authorized representative who has indicated his/her understanding and acceptance.     Plan Discussed with:   Anesthesia Plan Comments:         Anesthesia Quick Evaluation

## 2013-12-14 NOTE — Op Note (Signed)
Preoperative diagnosis: Menometrorrhagia                                        Dysmenorrhea   Postoperative diagnoses: Same as above   Procedure: Hysteroscopy, uterine curettage, endometrial ablation  Surgeon: Despina Hidden MD  Anesthesia: Laryngeal mask airway  Findings: The endometrium was normal. There were no fibroid or other abnormalities.  Description of operation: The patient was taken to the operating room and placed in the supine position. She underwent general anesthesia using the laryngeal mask airway. She was placed in the dorsal lithotomy position and prepped and draped in the usual sterile fashion. A Graves speculum was placed and the anterior cervical lip was grasped with a single-tooth tenaculum. The cervix was dilated serially to allow passage of the hysteroscope. Diagnostic hysteroscopy was performed and was found to be normal. A vigorous uterine curettage was then performed and all tissue sent to pathology for evaluation. The ThermaChoice 3 endometrial ablation balloon was then used were 14 cc of D5W was required to maintain a pressure of 190-200 mm of mercury throughout the procedure. Toatl therapy time was 8:51.  All of the equipment worked well throughout the procedure. All of the fluid was returned at the end of the procedure. The patient was awakened from anesthesia and taken to the recovery room in good stable condition all counts were correct. She received 2 g of Ancef and 30 mg of Toradol preoperatively. She will be discharged from the recovery room and followed up in the office in 1- 2 weeks.  EURE,LUTHER H 12/14/2013 1:23 PM

## 2013-12-14 NOTE — Discharge Instructions (Signed)
Endometrial Ablation Endometrial ablation removes the lining of the uterus (endometrium). It is usually a same-day, outpatient treatment. Ablation helps avoid major surgery, such as surgery to remove the cervix and uterus (hysterectomy). After endometrial ablation, you will have little or no menstrual bleeding and may not be able to have children. However, if you are premenopausal, you will need to use a reliable method of birth control following the procedure because of the small chance that pregnancy can occur. There are different reasons to have this procedure, which include:  Heavy periods.  Bleeding that is causing anemia.  Irregular bleeding.  Bleeding fibroids on the lining inside the uterus if they are smaller than 3 centimeters. This procedure should not be done if:  You want children in the future.  You have severe cramps with your menstrual period.  You have precancerous or cancerous cells in your uterus.  You were recently pregnant.  You have gone through menopause.  You have had major surgery on the uterus, such as a cesarean delivery. LET YOUR HEALTH CARE PROVIDER KNOW ABOUT:  Any allergies you have.  All medicines you are taking, including vitamins, herbs, eye drops, creams, and over-the-counter medicines.  Previous problems you or members of your family have had with the use of anesthetics.  Any blood disorders you have.  Previous surgeries you have had.  Medical conditions you have. RISKS AND COMPLICATIONS  Generally, this is a safe procedure. However, as with any procedure, complications can occur. Possible complications include:  Perforation of the uterus.  Bleeding.  Infection of the uterus, bladder, or vagina.  Injury to surrounding organs.  An air bubble to the lung (air embolus).  Pregnancy following the procedure.  Failure of the procedure to help the problem, requiring hysterectomy.  Decreased ability to diagnose cancer in the lining of  the uterus. BEFORE THE PROCEDURE  The lining of the uterus must be tested to make sure there is no pre-cancerous or cancer cells present.  An ultrasound may be performed to look at the size of the uterus and to check for abnormalities.  Medicines may be given to thin the lining of the uterus. PROCEDURE  During the procedure, your health care provider will use a tool called a resectoscope to help see inside your uterus. There are different ways to remove the lining of your uterus.   Radiofrequency - This method uses a radiofrequency-alternating electric current to remove the lining of the uterus.  Cryotherapy - This method uses extreme cold to freeze the lining of the uterus.  Heated-Free Liquid - This method uses heated salt (saline) solution to remove the lining of the uterus.  Microwave - This method uses high-energy microwaves to heat up the lining of the uterus to remove it.  Thermal balloon - This method involves inserting a catheter with a balloon tip into the uterus. The balloon tip is filled with heated fluid to remove the lining of the uterus. AFTER THE PROCEDURE  After your procedure, do not have sexual intercourse or insert anything into your vagina until permitted by your health care provider. After the procedure, you may experience:  Cramps.  Vaginal discharge.  Frequent urination. Document Released: 02/07/2004 Document Revised: 11/30/2012 Document Reviewed: 08/31/2012 ExitCare Patient Information 2015 [employer]. This information is not intended to replace advice given to you by your health care provider. Make sure you discuss any questions you have with your health care provider.  

## 2013-12-19 ENCOUNTER — Encounter (HOSPITAL_COMMUNITY): Payer: Self-pay | Admitting: Obstetrics & Gynecology

## 2013-12-21 ENCOUNTER — Ambulatory Visit (INDEPENDENT_AMBULATORY_CARE_PROVIDER_SITE_OTHER): Payer: BC Managed Care – PPO | Admitting: Obstetrics & Gynecology

## 2013-12-21 ENCOUNTER — Encounter: Payer: Self-pay | Admitting: Obstetrics & Gynecology

## 2013-12-21 VITALS — BP 122/72 | Ht 62.5 in | Wt 151.0 lb

## 2013-12-21 DIAGNOSIS — Z9889 Other specified postprocedural states: Secondary | ICD-10-CM

## 2013-12-21 MED ORDER — DESOGESTREL-ETHINYL ESTRADIOL 0.15-30 MG-MCG PO TABS: ORAL_TABLET | ORAL | Status: DC

## 2013-12-21 NOTE — Progress Notes (Signed)
Patient ID: Desiree Chen, female   DOB: [birth year], 43 y.o.   MRN: [id number] 1 week post op from hysteroscopy uterine curettage endometrial ablation  No complaints Doing well  Watery discharge  Blood pressure 122/72, height 5' 2.5" (1.588 m), weight 151 lb (68.493 kg), last menstrual period 12/08/2013.  NEFG Watery blod tinged discharge  Follow up prn or 1 year

## 2013-12-21 NOTE — Addendum Note (Signed)
Addended by: Lazaro Arms on: 12/21/2013 04:57 PM   Modules accepted: Orders

## 2014-01-08 ENCOUNTER — Telehealth: Payer: Self-pay | Admitting: Obstetrics & Gynecology

## 2014-01-08 NOTE — Telephone Encounter (Signed)
Pt states had Endometrial Ablation on 12/14/2013 continues to have a watery discharge, no odor, no fever, no pain. Is the watery discharge normal 3 weeks post op? Pt informed to continue to monitor discharge call office back for odor, color, irritation, or fever. Dr. Despina Hidden out of office today will discuss pt concerns and call pt back. Pt verbalized understanding.

## 2014-01-10 ENCOUNTER — Telehealth: Payer: Self-pay | Admitting: Obstetrics & Gynecology

## 2014-01-10 NOTE — Telephone Encounter (Signed)
Pt informed watery discharge x 3 weeks after endometrial ablation is normal. Call office back for fever, odor, irritation, color in discharge. Pt verbalized understanding.

## 2014-02-12 ENCOUNTER — Encounter: Payer: Self-pay | Admitting: Obstetrics & Gynecology

## 2014-02-23 ENCOUNTER — Other Ambulatory Visit (HOSPITAL_COMMUNITY): Payer: Self-pay | Admitting: Family Medicine

## 2014-02-23 DIAGNOSIS — Z1231 Encounter for screening mammogram for malignant neoplasm of breast: Secondary | ICD-10-CM

## 2014-04-02 ENCOUNTER — Ambulatory Visit (HOSPITAL_COMMUNITY)
Admission: RE | Admit: 2014-04-02 | Discharge: 2014-04-02 | Disposition: A | Discharge status: Home / Self Care | Payer: BC Managed Care – PPO | Source: Ambulatory Visit | Attending: Family Medicine | Admitting: Family Medicine

## 2014-04-02 DIAGNOSIS — Z1231 Encounter for screening mammogram for malignant neoplasm of breast: Secondary | ICD-10-CM | POA: Diagnosis not present

## 2014-09-02 ENCOUNTER — Other Ambulatory Visit: Payer: Self-pay | Admitting: Obstetrics & Gynecology

## 2014-09-20 ENCOUNTER — Ambulatory Visit (INDEPENDENT_AMBULATORY_CARE_PROVIDER_SITE_OTHER): Payer: BC Managed Care – PPO | Admitting: Podiatry

## 2014-09-20 ENCOUNTER — Encounter: Payer: Self-pay | Admitting: Podiatry

## 2014-09-20 ENCOUNTER — Ambulatory Visit (INDEPENDENT_AMBULATORY_CARE_PROVIDER_SITE_OTHER): Payer: BC Managed Care – PPO

## 2014-09-20 VITALS — BP 125/81 | HR 76 | Resp 18

## 2014-09-20 DIAGNOSIS — M2011 Hallux valgus (acquired), right foot: Secondary | ICD-10-CM

## 2014-09-20 DIAGNOSIS — R52 Pain, unspecified: Secondary | ICD-10-CM

## 2014-09-20 DIAGNOSIS — M722 Plantar fascial fibromatosis: Secondary | ICD-10-CM | POA: Diagnosis not present

## 2014-09-20 DIAGNOSIS — M21611 Bunion of right foot: Secondary | ICD-10-CM

## 2014-09-20 NOTE — Progress Notes (Signed)
   Subjective:    Patient ID: Desiree Chen, female    DOB: 11/16/1970, 44 y.o.   MRN: [id number]  HPI I HAVE RIGHT HEEL PAIN AND IT HAS BEEN GOING ON FOR ABOUT 6 MONTHS AND SAW DR Casimiro Needle JOYCE IN [place] AND IT DOES BURN AND THROB AND IS SORE AND SHOOTING PAINS AND I HAVE ALREADY HAD 3 SHOTS IN THE LAST 6 MONTHS AND DOES GET NUMB AND TINGLE SOME    Review of Systems  Constitutional: Positive for fatigue.       NIGHT SWEATS  All other systems reviewed and are negative.      Objective:   Physical Exam        Assessment & Plan:

## 2014-09-21 NOTE — Progress Notes (Signed)
Subjective:     Patient ID: Desiree Chen, female   DOB: 03/18/1971, 44 y.o.   MRN: [id number]  HPI patient has plantar heel pain right of at least one year duration stating it's been very bad for 6 months she's had 3 injections inserts anti-inflammatory is without relief of symptoms and also has a bunion deformity right which can become symptomatic with certain shoes and is seemingly to becoming more prominent as time goes on   Review of Systems  All other systems reviewed and are negative.      Objective:   Physical Exam  Constitutional: She is oriented to person, place, and time.  Cardiovascular: Intact distal pulses.   Musculoskeletal: Normal range of motion.  Neurological: She is oriented to person, place, and time.  Skin: Skin is warm.  Nursing note and vitals reviewed.  neurovascular status found to be intact with muscle strength adequate range of motion within normal limits. Patient's noted to have severe discomfort medial fascial band right at the insertion tendon the calcaneus and hyperostosis medial aspect first metatarsal head right with some irritation when palpated. Patient is noted to have good digital perfusion is well oriented 3 with moderate depression of the arch     Assessment:     Chronic plantar fasciitis right with failure to respond to numerous conservative cares and structural bunion which seems to be getting worse on the right foot    Plan:     H&P and reviewed x-rays done with patient. At this time I discussed at great length different treatment options for the heel and bunion and at this time due to failure to respond and her being so tired of pain she is opted for surgical intervention. I recommended endoscopic release of the fascial band medial side along with structural bunion correction right with pin fixation. At this time since she did come from out of town I allowed her to read a consent form reviewing alternative treatments and complications as  indicated. Patient understands all this willing to accept risk and understands total recovery. Can take 6 months to one year and is scheduled for outpatient surgery. Patient is given all preoperative instructions and walking boot and is encouraged to call with any questions prior to surgery

## 2014-09-24 ENCOUNTER — Encounter: Payer: Self-pay | Admitting: *Deleted

## 2014-09-24 ENCOUNTER — Telehealth: Payer: Self-pay | Admitting: *Deleted

## 2014-09-26 NOTE — Telephone Encounter (Signed)
"  I requested you send a doctor's note to my work so I can get my FMLA started.  I'm scheduled for surgery on 10/09/2014.  Please give me a call.  I requested it on Friday.  They haven't received it yet.  Let me know if you received my message.    I called and informed patient that I had faxed the letter on Monday.  "I got it, right when I hung up leaving the message it was coming through.  Thanks so much."

## 2014-10-05 ENCOUNTER — Ambulatory Visit: Payer: BC Managed Care – PPO | Admitting: Obstetrics & Gynecology

## 2014-10-09 ENCOUNTER — Telehealth: Payer: Self-pay | Admitting: *Deleted

## 2014-10-09 DIAGNOSIS — M2011 Hallux valgus (acquired), right foot: Secondary | ICD-10-CM | POA: Diagnosis not present

## 2014-10-09 DIAGNOSIS — M722 Plantar fascial fibromatosis: Secondary | ICD-10-CM | POA: Diagnosis not present

## 2014-10-09 NOTE — Telephone Encounter (Signed)
Pt states she forgot if Dr. Charlsie Merlesegal said she could come out of the boot.  I told pt she had to be in the boot at all times even sleeping and could only take off the boot and rotate the foot around, but must put the boot back on.  Pt agreed.

## 2014-10-10 ENCOUNTER — Encounter: Payer: Self-pay | Admitting: *Deleted

## 2014-10-11 ENCOUNTER — Telehealth: Payer: Self-pay | Admitting: *Deleted

## 2014-10-11 NOTE — Telephone Encounter (Signed)
Pt states the bunion site is very painful is that normal.  I told pt the larger bone surgery can be more uncomfortable, to rest, continue icing and elevation.  I told pt she could take the ace off and elevate the foot for 15 minutes, then rewrap ace looser, ice during this time, then reapply the boot.  I also informed pt she could take Ibuprofen OTC as package directs, between the Vicodin dosing if she tolerated the Ibuprofen.  Pt states understanding.

## 2014-10-12 ENCOUNTER — Encounter: Payer: Self-pay | Admitting: *Deleted

## 2014-10-12 ENCOUNTER — Telehealth: Payer: Self-pay | Admitting: *Deleted

## 2014-10-12 NOTE — Progress Notes (Signed)
Entered in error

## 2014-10-12 NOTE — Progress Notes (Signed)
[facility] Endoscopic Plantar Fasciotomy right foot, diagnosis Hallux Abducto Valgus and Plantar Fasciitis.  Prescription was written for Demerol 50mg , quantity 40, take 1-2 po q4-6h prn pain.

## 2014-10-12 NOTE — Telephone Encounter (Signed)
I'm calling to see how you're doing.  "I'm doing okay.  I called and spoke to someone yesterday about the pain I was experiencing in the Bunion area.  It feels a little better now than it did before."  How's the pain medication doing for you?  "It's like taking a sugar pill."  Are you able to take Ibuprofen or Tylenol?  "The nurse I spoke to yesterday told me to take Ibuprofen in between dosages.  So, that's what I've been doing."  Try to keep it elevated as much as possible.  He recommends that you only be up on it 15 minutes per hour if you need to go to the bathroom or cook something.  "I can walk on it, I've been using crutches?"  Yes, he recommends you try to do without crutches.  "Oh, I mind as well get ready for pain at a higher level."  How was your experience at the surgical center?  "Everything was fine, no problems."  Give us a call on the on-call pager if you have any questions or concerns over the weekend..Marland Kitchen

## 2014-10-16 ENCOUNTER — Ambulatory Visit (INDEPENDENT_AMBULATORY_CARE_PROVIDER_SITE_OTHER): Payer: BC Managed Care – PPO | Admitting: Podiatry

## 2014-10-16 ENCOUNTER — Encounter: Payer: Self-pay | Admitting: Podiatry

## 2014-10-16 VITALS — BP 120/85 | HR 109 | Resp 15

## 2014-10-16 DIAGNOSIS — M21611 Bunion of right foot: Secondary | ICD-10-CM

## 2014-10-16 DIAGNOSIS — M2011 Hallux valgus (acquired), right foot: Secondary | ICD-10-CM

## 2014-10-16 DIAGNOSIS — M722 Plantar fascial fibromatosis: Secondary | ICD-10-CM

## 2014-10-16 NOTE — Progress Notes (Signed)
Subjective:     Patient ID: Desiree ColeVicky R Priddy, female   DOB: 03/19/1971, 44 y.o.   MRN: [id number]  HPI patient states I'm doing well with my right foot and I'm able to walk with minimal discomfort or swelling   Review of Systems     Objective:   Physical Exam Neurovascular status intact muscle strength adequate negative Homans sign noted with wound edges well coapted on the right first metatarsal and the right heel medial and lateral side    Assessment:     Doing well from post first metatarsal osteotomy and endoscopic surgery of the right heel    Plan:     H&P and condition reviewed with patient and at this time I reapplied sterile dressing and reviewed her x-rays. Patient will continue elevation compression immobilization and will be seen back 2 weeks for suture removal or earlier if necessary

## 2014-10-23 ENCOUNTER — Telehealth: Payer: Self-pay | Admitting: *Deleted

## 2014-10-23 NOTE — Telephone Encounter (Addendum)
Pt states has questions about her bunion surgery.  I was unable to leave a message on mobile phone as requested, left message on home line to call again with questions.  Pt asked how long should she be up in the surgical shoe rather than the surgical boot, should she still have some pain and swelling, and what about showering, she still covers her foot in the surgical shoe and showers because she's more comfortable, and how much to wiggle the big toe.  I told pt to alternate between the surgical boot and surgical shoe by wearing the shoe at home and the boot out for safety, and up on the foot between 10 and 15 minutes/hour, she may have varying degrees of swelling and pain for 6-9 months, if showering is more comfortable in the surgical shoe continue, but after the sutures are removed 24-48 hours later may want to try showering without the shoe, and wiggle the toe without using her hands periodically and also the gentle hand exercises as directed by Dr. Charlsie Merlesegal will help loosen the joint.  Pt states understanding.

## 2014-10-30 ENCOUNTER — Ambulatory Visit: Payer: BC Managed Care – PPO | Admitting: *Deleted

## 2014-10-30 DIAGNOSIS — M21611 Bunion of right foot: Secondary | ICD-10-CM

## 2014-10-30 NOTE — Progress Notes (Signed)
   Subjective:    Patient ID: Desiree Chen, female    DOB: 02/19/1971, 44 y.o.   MRN: [id number]  HPI Comments: DOS 10/09/2014 Right 1st Metatarsal Osteotomy and Endoscopic plantar fasciotomy.  I Instructed pt to begin the compression anklet tomorrow morning and showering in the evening, continue the surgical shoe for next two weeks alternating with cam walker.     Review of Systems     Objective:   Physical Exam        Assessment & Plan:

## 2014-11-05 ENCOUNTER — Telehealth: Payer: Self-pay | Admitting: *Deleted

## 2014-11-05 NOTE — Telephone Encounter (Signed)
Pt states the bunion site is itchy, can she put on anti-itch cream.  I told pt the site was dry, and could put a little neosporin along the site.

## 2014-11-13 ENCOUNTER — Encounter: Payer: BC Managed Care – PPO | Admitting: Podiatry

## 2014-11-14 ENCOUNTER — Encounter: Payer: Self-pay | Admitting: Podiatry

## 2014-11-14 ENCOUNTER — Ambulatory Visit (INDEPENDENT_AMBULATORY_CARE_PROVIDER_SITE_OTHER): Payer: BC Managed Care – PPO

## 2014-11-14 ENCOUNTER — Ambulatory Visit (INDEPENDENT_AMBULATORY_CARE_PROVIDER_SITE_OTHER): Payer: BC Managed Care – PPO | Admitting: Podiatry

## 2014-11-14 VITALS — BP 132/79 | HR 71 | Resp 15

## 2014-11-14 DIAGNOSIS — Z9889 Other specified postprocedural states: Secondary | ICD-10-CM

## 2014-11-14 DIAGNOSIS — M2011 Hallux valgus (acquired), right foot: Secondary | ICD-10-CM | POA: Diagnosis not present

## 2014-11-14 DIAGNOSIS — M722 Plantar fascial fibromatosis: Secondary | ICD-10-CM

## 2014-11-14 DIAGNOSIS — R609 Edema, unspecified: Secondary | ICD-10-CM

## 2014-11-14 DIAGNOSIS — M21611 Bunion of right foot: Secondary | ICD-10-CM

## 2014-11-14 NOTE — Progress Notes (Signed)
Subjective:     Patient ID: Desiree Chen, female   DOB: 1970-08-29, 44 y.o.   MRN: [id number]  HPI patient states she's doing well with her right foot and would like to be able to wear shoes and is having some swelling but not   Review of Systems     Objective:   Physical Exam Neurovascular status intact with minimal edema noted in the right forefoot with incision sites on the plantar aspect of the heel doing well and the first metatarsal with good range of motion first MPJ with no crepitus of the joint noted    Assessment:     Doing well post surgery of the right foot    Plan:     Reviewed the importance of exercises for the first MPJ and since there is some restriction I did go ahead and dispensed a above ankle brace to lower the big toe and stretch the first MPJ and capsule. Explained to her how to wear this and also the importance of walking on her entire foot and gradual return and utilization of tennis shoes. Patient be seen back 4 weeks or earlier and I reviewed her x-rays with her today

## 2014-11-22 ENCOUNTER — Telehealth: Payer: Self-pay | Admitting: *Deleted

## 2014-11-22 NOTE — Telephone Encounter (Signed)
Pt states she is still not able to push big toe down and the brace does not seem to help it, and she is concerned because she will be returning to work 12/04/2014.  I told pt I felt she would benefit from an appt with Dr. Charlsie Merles to evaluate the proper brace use and ROM.  Pt agreed and will call tomorrow to schedule or our scheduler will call her in the morning.

## 2014-11-26 ENCOUNTER — Encounter: Payer: Self-pay | Admitting: Podiatry

## 2014-11-26 ENCOUNTER — Ambulatory Visit (INDEPENDENT_AMBULATORY_CARE_PROVIDER_SITE_OTHER): Payer: BC Managed Care – PPO

## 2014-11-26 ENCOUNTER — Ambulatory Visit (INDEPENDENT_AMBULATORY_CARE_PROVIDER_SITE_OTHER): Payer: BC Managed Care – PPO | Admitting: Podiatry

## 2014-11-26 VITALS — BP 115/74 | HR 77 | Resp 16

## 2014-11-26 DIAGNOSIS — M779 Enthesopathy, unspecified: Secondary | ICD-10-CM

## 2014-11-26 DIAGNOSIS — M79671 Pain in right foot: Secondary | ICD-10-CM

## 2014-11-26 MED ORDER — TRIAMCINOLONE ACETONIDE 10 MG/ML IJ SUSP
10.0000 mg | Freq: Once | INTRAMUSCULAR | Status: AC
  Administered 2014-11-26: 10 mg

## 2014-11-27 NOTE — Progress Notes (Signed)
Subjective:     Patient ID: Desiree Chen, female   DOB: 1970/10/01, 44 y.o.   MRN: [id number]  HPI patient presents stating I was just concerned because it had some swelling around my incision site pain in my arch and a clicking noise in my right big toe joint   Review of Systems     Objective:   Physical Exam Neurovascular status intact negative Homan sign was noted with wound edges that are well coapted first metatarsal right and slight redness on the medial lateral aspect of the right heel but normal with no drainage or wound edge dehiscence.    Assessment:     Doing well with possibility of irritation of the joint surface which I will check with x-Stull but may be due to fluid accumulation    Plan:     Reviewed condition and at this time I went ahead and I advised her that this is normal and I reviewed the x-rays with the patient. Patient will be seen back for [place] to recheck again in her regular visit time and was instructed on continued physical therapy

## 2014-12-03 ENCOUNTER — Telehealth: Payer: Self-pay | Admitting: *Deleted

## 2014-12-03 ENCOUNTER — Encounter: Payer: Self-pay | Admitting: *Deleted

## 2014-12-03 NOTE — Telephone Encounter (Addendum)
Pt states she needs a note to state she is released to return to work on 12/04/2014, to fax to [phone], and to call her to confirm sent.  Pt called to see if the return-to-work note was prepared.  I told her I would call her once it had been faxed.  I informed pt, Dr. Charlsie Merles released pt to return to full duty at work. Faxed to [phone].

## 2014-12-13 ENCOUNTER — Telehealth: Payer: Self-pay | Admitting: *Deleted

## 2014-12-13 NOTE — Telephone Encounter (Signed)
Pt complains of knot on top of foot that feels like fluid in a way, it is between surgery site and 2nd metatarsal and the ankle.  I told pt to continue the anklet for compression, ice at the end of the day, walk as normal as she can, because a awkward gait can irritate the structures of her foot, and it could take 6-9 months to get back to normal, and to call with concerns.

## 2014-12-26 ENCOUNTER — Ambulatory Visit (INDEPENDENT_AMBULATORY_CARE_PROVIDER_SITE_OTHER): Payer: BC Managed Care – PPO | Admitting: Podiatry

## 2014-12-26 ENCOUNTER — Encounter: Payer: Self-pay | Admitting: Podiatry

## 2014-12-26 ENCOUNTER — Ambulatory Visit (INDEPENDENT_AMBULATORY_CARE_PROVIDER_SITE_OTHER): Payer: BC Managed Care – PPO

## 2014-12-26 VITALS — BP 127/73 | HR 72 | Resp 16

## 2014-12-26 DIAGNOSIS — M722 Plantar fascial fibromatosis: Secondary | ICD-10-CM

## 2014-12-26 DIAGNOSIS — Z9889 Other specified postprocedural states: Secondary | ICD-10-CM

## 2014-12-26 DIAGNOSIS — M21611 Bunion of right foot: Secondary | ICD-10-CM

## 2014-12-26 DIAGNOSIS — M2011 Hallux valgus (acquired), right foot: Secondary | ICD-10-CM

## 2014-12-26 NOTE — Progress Notes (Signed)
Subjective:     Patient ID: Desiree Chen, female   DOB: 06-22-1970, 44 y.o.   MRN: [id number]  HPI patient presents stating I'm doing very well with my foot   Review of Systems     Objective:   Physical Exam Neurovascular status intact with incision site healed well wound edges well coapted and good alignment of the first MPJ with excellent range of motion    Assessment:     Doing well post [place] osteotomy right    Plan:     Reviewed final x-rays and discharge patient at this time. She will be seen back if any issues were to occur

## 2015-03-11 ENCOUNTER — Other Ambulatory Visit (HOSPITAL_COMMUNITY): Payer: Self-pay | Admitting: Family Medicine

## 2015-03-11 DIAGNOSIS — Z1231 Encounter for screening mammogram for malignant neoplasm of breast: Secondary | ICD-10-CM

## 2015-03-15 IMAGING — MG MM DIGITAL SCREENING
4 series · 4 of 4 positions shown · non-contrast
Comparison: Previous exam(s).

CLINICAL DATA: Screening.

EXAM:
DIGITAL SCREENING BILATERAL MAMMOGRAM WITH CAD

[L CC]
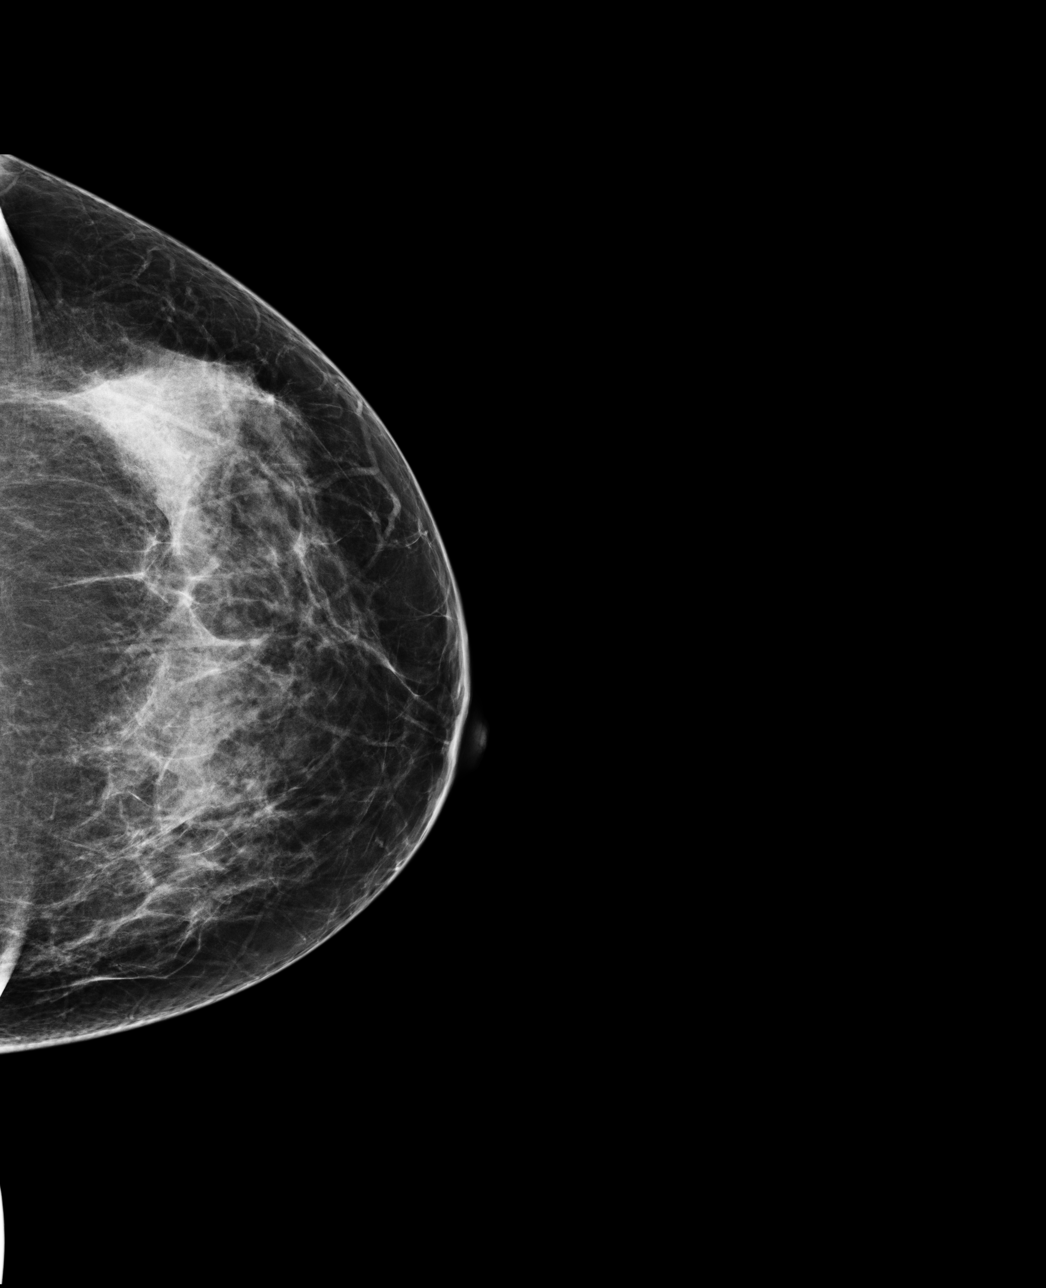

[L MLO]
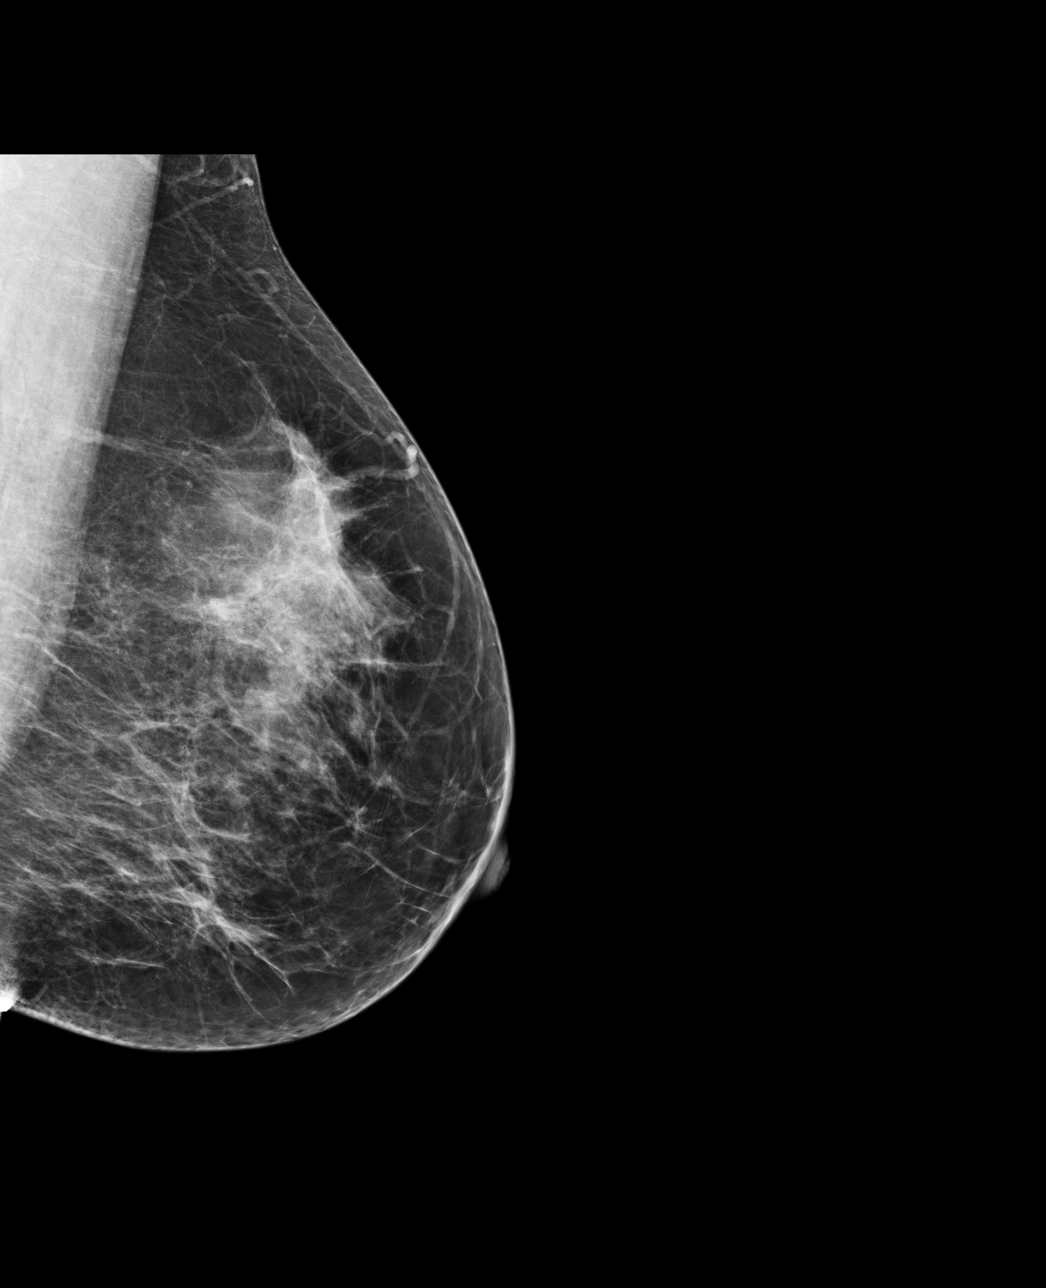

[R CC]
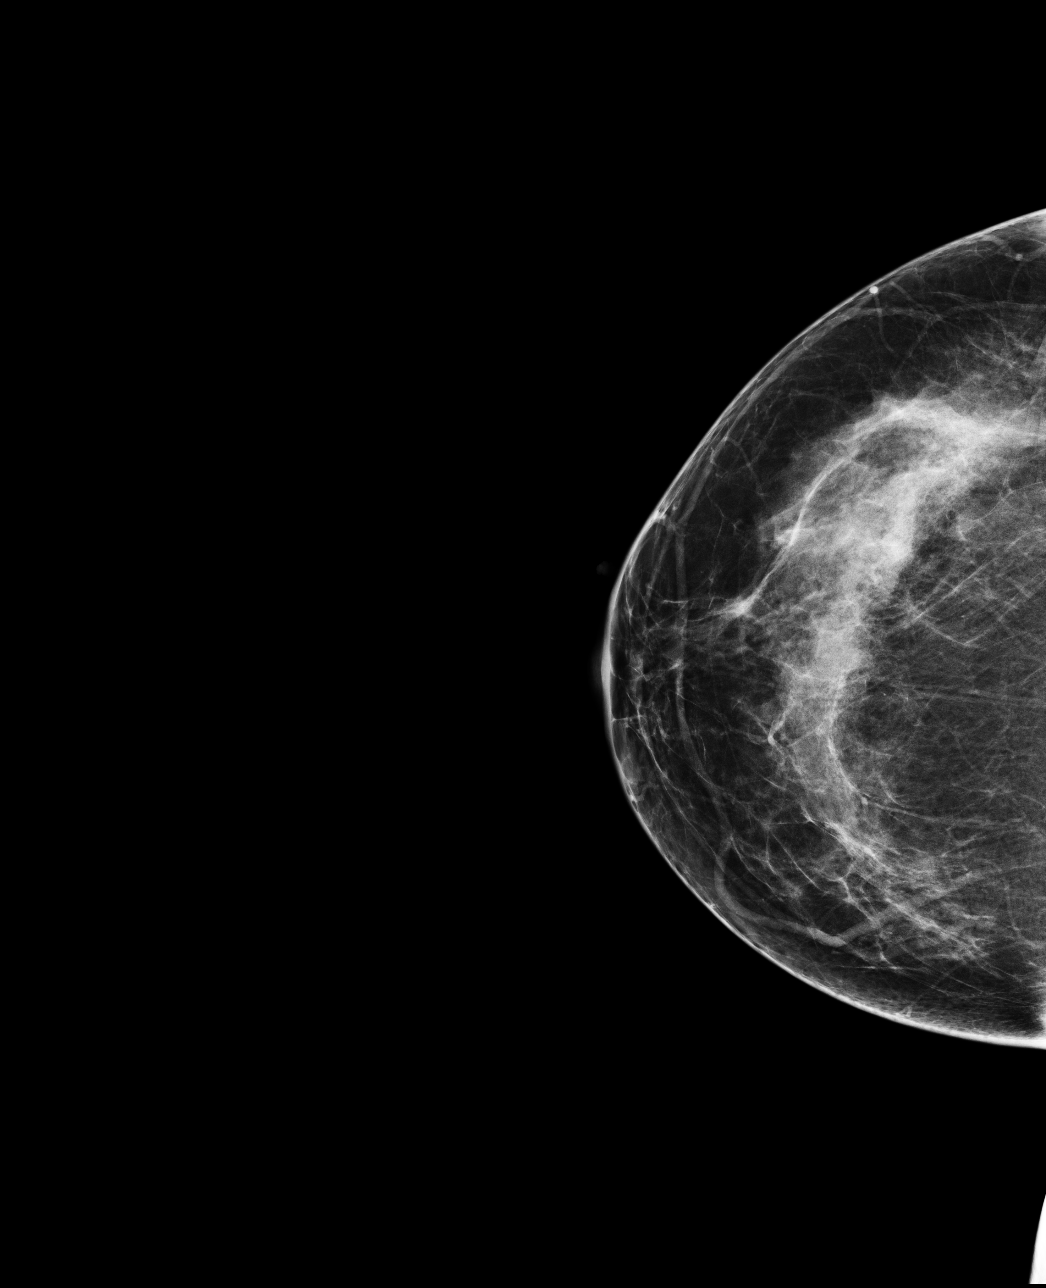

[R MLO]
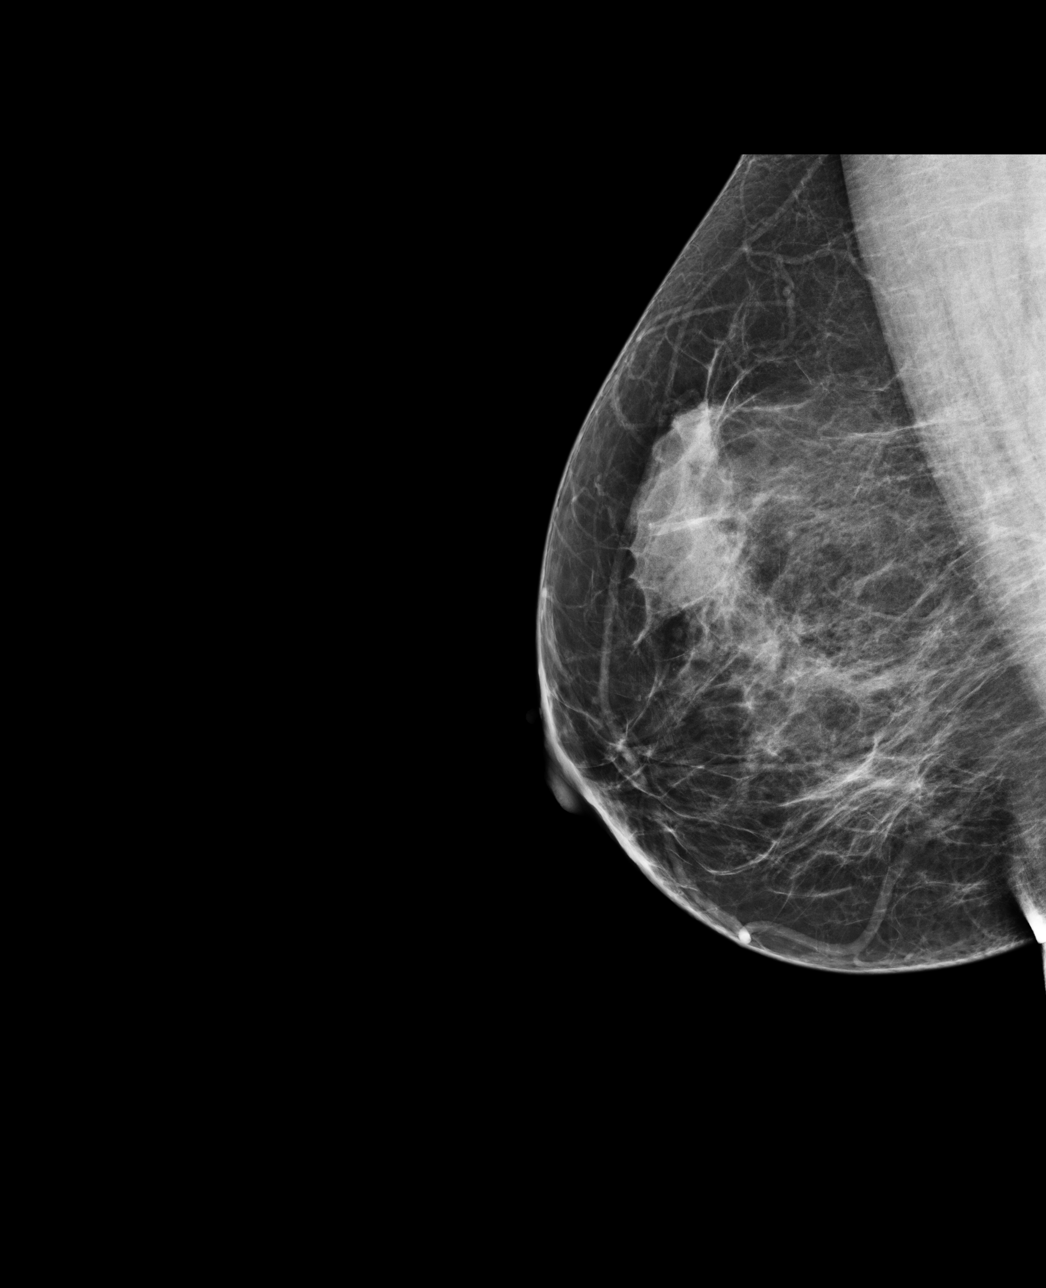

[4 of 4 positions shown; findings below may reference images not displayed]

ACR Breast Density Category c: The breasts are heterogeneously
dense, which may obscure small masses.
FINDINGS: There are no findings suspicious for malignancy. Images were
processed with CAD.
IMPRESSION: No mammographic evidence of malignancy. A result letter of this
screening mammogram will be mailed directly to the patient.

RECOMMENDATION:
Screening mammogram in one year. (Code:ZN-B-8X6)

BI-RADS CATEGORY  1: Negative

## 2015-04-05 ENCOUNTER — Ambulatory Visit (HOSPITAL_COMMUNITY)
Admission: RE | Admit: 2015-04-05 | Discharge: 2015-04-05 | Disposition: A | Discharge status: Home / Self Care | Payer: BC Managed Care – PPO | Source: Ambulatory Visit | Attending: Family Medicine | Admitting: Family Medicine

## 2015-04-05 DIAGNOSIS — R922 Inconclusive mammogram: Secondary | ICD-10-CM | POA: Diagnosis not present

## 2015-04-05 DIAGNOSIS — Z1231 Encounter for screening mammogram for malignant neoplasm of breast: Secondary | ICD-10-CM | POA: Diagnosis present

## 2015-04-10 ENCOUNTER — Other Ambulatory Visit: Payer: Self-pay | Admitting: Family Medicine

## 2015-04-10 DIAGNOSIS — R928 Other abnormal and inconclusive findings on diagnostic imaging of breast: Secondary | ICD-10-CM

## 2015-04-11 ENCOUNTER — Other Ambulatory Visit (HOSPITAL_COMMUNITY): Payer: Self-pay | Admitting: Family Medicine

## 2015-04-11 DIAGNOSIS — N6310 Unspecified lump in the right breast, unspecified quadrant: Secondary | ICD-10-CM

## 2015-04-16 ENCOUNTER — Encounter (HOSPITAL_COMMUNITY): Payer: BC Managed Care – PPO

## 2015-04-30 ENCOUNTER — Ambulatory Visit (HOSPITAL_COMMUNITY)
Admission: RE | Admit: 2015-04-30 | Discharge: 2015-04-30 | Disposition: A | Discharge status: Home / Self Care | Payer: BC Managed Care – PPO | Source: Ambulatory Visit | Attending: Family Medicine | Admitting: Family Medicine

## 2015-04-30 ENCOUNTER — Other Ambulatory Visit (HOSPITAL_COMMUNITY): Payer: Self-pay | Admitting: Family Medicine

## 2015-04-30 DIAGNOSIS — R928 Other abnormal and inconclusive findings on diagnostic imaging of breast: Secondary | ICD-10-CM | POA: Diagnosis present

## 2015-04-30 DIAGNOSIS — N6310 Unspecified lump in the right breast, unspecified quadrant: Secondary | ICD-10-CM

## 2015-05-15 ENCOUNTER — Other Ambulatory Visit: Payer: Self-pay | Admitting: Obstetrics & Gynecology

## 2016-04-14 ENCOUNTER — Other Ambulatory Visit (HOSPITAL_COMMUNITY): Payer: Self-pay | Admitting: Family Medicine

## 2016-04-14 DIAGNOSIS — Z1231 Encounter for screening mammogram for malignant neoplasm of breast: Secondary | ICD-10-CM

## 2016-05-04 ENCOUNTER — Ambulatory Visit (HOSPITAL_COMMUNITY)
Admission: RE | Admit: 2016-05-04 | Discharge: 2016-05-04 | Disposition: A | Discharge status: Home / Self Care | Payer: BC Managed Care – PPO | Source: Ambulatory Visit | Attending: Family Medicine | Admitting: Family Medicine

## 2016-05-04 DIAGNOSIS — Z1231 Encounter for screening mammogram for malignant neoplasm of breast: Secondary | ICD-10-CM | POA: Diagnosis present

## 2016-06-08 ENCOUNTER — Other Ambulatory Visit (HOSPITAL_COMMUNITY): Payer: Self-pay | Admitting: Family Medicine

## 2016-06-08 DIAGNOSIS — M5410 Radiculopathy, site unspecified: Secondary | ICD-10-CM

## 2016-06-08 DIAGNOSIS — M545 Low back pain: Secondary | ICD-10-CM

## 2016-06-08 DIAGNOSIS — M5136 Other intervertebral disc degeneration, lumbar region: Secondary | ICD-10-CM

## 2016-06-16 ENCOUNTER — Ambulatory Visit (HOSPITAL_COMMUNITY)
Admission: RE | Admit: 2016-06-16 | Discharge: 2016-06-16 | Disposition: A | Discharge status: Home / Self Care | Payer: BC Managed Care – PPO | Source: Ambulatory Visit | Attending: Family Medicine | Admitting: Family Medicine

## 2016-06-16 DIAGNOSIS — M48061 Spinal stenosis, lumbar region without neurogenic claudication: Secondary | ICD-10-CM | POA: Diagnosis not present

## 2016-06-16 DIAGNOSIS — M5126 Other intervertebral disc displacement, lumbar region: Secondary | ICD-10-CM | POA: Insufficient documentation

## 2016-06-16 DIAGNOSIS — M5410 Radiculopathy, site unspecified: Secondary | ICD-10-CM | POA: Insufficient documentation

## 2016-06-16 DIAGNOSIS — M545 Low back pain: Secondary | ICD-10-CM

## 2016-06-16 DIAGNOSIS — M5127 Other intervertebral disc displacement, lumbosacral region: Secondary | ICD-10-CM | POA: Insufficient documentation

## 2016-06-16 DIAGNOSIS — M5136 Other intervertebral disc degeneration, lumbar region: Secondary | ICD-10-CM

## 2017-04-07 ENCOUNTER — Other Ambulatory Visit (HOSPITAL_COMMUNITY): Payer: Self-pay | Admitting: Family Medicine

## 2017-04-07 DIAGNOSIS — Z1231 Encounter for screening mammogram for malignant neoplasm of breast: Secondary | ICD-10-CM

## 2017-05-10 ENCOUNTER — Other Ambulatory Visit (HOSPITAL_COMMUNITY): Payer: Self-pay | Admitting: Family Medicine

## 2017-05-10 ENCOUNTER — Ambulatory Visit (HOSPITAL_COMMUNITY)
Admission: RE | Admit: 2017-05-10 | Discharge: 2017-05-10 | Disposition: A | Discharge status: Home / Self Care | Payer: BC Managed Care – PPO | Source: Ambulatory Visit | Attending: Family Medicine | Admitting: Family Medicine

## 2017-05-10 DIAGNOSIS — Z1231 Encounter for screening mammogram for malignant neoplasm of breast: Secondary | ICD-10-CM | POA: Diagnosis present

## 2018-04-25 ENCOUNTER — Ambulatory Visit (INDEPENDENT_AMBULATORY_CARE_PROVIDER_SITE_OTHER): Payer: Self-pay

## 2018-04-25 DIAGNOSIS — Z8371 Family history of colonic polyps: Secondary | ICD-10-CM

## 2018-04-25 NOTE — Progress Notes (Addendum)
Gastroenterology Pre-Procedure Review  Request Date:04/25/18 Requesting Physician: Dr.Golding- no previous tcs  PATIENT REVIEW QUESTIONS: The patient responded to the following health history questions as indicated:    1. Diabetes Melitis: no 2. Joint replacements in the past 12 months: no 3. Major health problems in the past 3 months: yes (herniated disk) 4. Has an artificial valve or MVP: no 5. Has a defibrillator: no 6. Has been advised in past to take antibiotics in advance of a procedure like teeth cleaning: no 7. Family history of colon cancer: no crc, but pts brother had part of his colon removed- pt is not sure why, her brother is deceased now. Also pts sister, who is 49, just had part of her colon removed, 3 weeks ago,  due to a very large polyp which she was told was pre-cancerous. Pt is very concerned about this. Spoke with AB- diagnosis code needs to be z83.71, spoke with [employer] [place]- see below.  8. Alcohol Use: no 9. History of sleep apnea: no  10. History of coronary artery or other vascular stents placed within the last 12 months: no 11. History of any prior anesthesia complications: no    MEDICATIONS & ALLERGIES:    Patient reports the following regarding taking any blood thinners:   Plavix? no Aspirin? no Coumadin? no Brilinta? no Xarelto? no Eliquis? no Pradaxa? no Savaysa? no Effient? no  Patient confirms/reports the following medications:  Current Outpatient Medications  Medication Sig Dispense Refill  . Cholecalciferol (VITAMIN D3) 125 MCG (5000 UT) CAPS Take by mouth daily.    . citalopram (CELEXA) 10 MG tablet Take by mouth daily.    . cyclobenzaprine (FLEXERIL) 10 MG tablet Take 10 mg by mouth as needed for muscle spasms.    . fluticasone (FLONASE) 50 MCG/ACT nasal spray Place into both nostrils daily.    . pravastatin (PRAVACHOL) 20 MG tablet Take 20 mg by mouth daily.      No current facility-administered medications for this visit.     Patient  confirms/reports the following allergies:  Allergies  Allergen Reactions  . Demerol [Meperidine] Other (See Comments)    Made pt feel dizzy and flushed  . Prednisone     Pt got hot and flushed  . Sulfa Antibiotics Itching  . Mucinex [Guaifenesin Er] Rash    No orders of the defined types were placed in this encounter.   AUTHORIZATION INFORMATION Primary Insurance: [employer] employees ,  ID #: [id number] Pre-Cert / Auth required:no Pre-Cert / Auth #: called [employer] [place]- spoke with Santina Evans- she said there is no guarantee of payment until the claim is processed. She said the 45-75 age range is just a recommendation and services are covered based on medical necessity. Ref #[id number]   SCHEDULE INFORMATION: Procedure has been scheduled as follows:  Date: 07/20/18, Time:10:30 Location:[facility] Dr.Rourk  This Gastroenterology Pre-Precedure Review Form is being routed to the following provider(s): Wynne Dust NP

## 2018-04-28 NOTE — Progress Notes (Signed)
I agree with scheduling given primary family history of large polyps requiring surgical resection.  I would make sure the patient is aware of what [employer] said, however, so there are no surprises.

## 2018-04-29 MED ORDER — NA SULFATE-K SULFATE-MG SULF 17.5-3.13-1.6 GM/177ML PO SOLN: 1.0000 | ORAL | 0 refills | Status: DC

## 2018-04-29 NOTE — Progress Notes (Signed)
I spoke with the pt, she is aware of what I was told by Santina Evans at [facility]. She said she wanted to have tcs, she feels like she really needs to have this done. She is scheduled for 07/20/18 (during her spring break from school). rx sent in and instructions and paperwork mailed to the pt. Advised her to be sure to get in plenty of clear liquids while on the clear liquid diet.   Also pt said she doesn't remember being allergic to demerol and is not sure why it is on her allergy list.

## 2018-04-29 NOTE — Addendum Note (Signed)
Addended by: Myra Rude on: 04/29/2018 10:49 AM   Modules accepted: Orders, SmartSet

## 2018-04-29 NOTE — Patient Instructions (Addendum)
Desiree Chen  11-11-1970 MRN: [id number]     Procedure Date: 10/05/2018 Time to register: 9:30am Place to register: [facility] Procedure Time: 10:30am Scheduled provider: R. Garfield Cornea, MD    PREPARATION FOR COLONOSCOPY WITH SUPREP BOWEL PREP KIT  Note: Suprep Bowel Prep Kit is a split-dose (2day) regimen. Consumption of BOTH 6-ounce bottles is required for a complete prep.  Please notify us immediately if you are diabetic, take iron supplements, or if you are on Coumadin or any other blood thinners.                                                                                                                                                  2 DAYS BEFORE PROCEDURE:  DATE: 10/03/2018  DAY: Monday Begin clear liquid diet AFTER your lunch meal. NO SOLID FOODS after this point.  1 DAY BEFORE PROCEDURE:  DATE: 10/04/2018   DAY: Tuesday Continue clear liquids the entire day - NO SOLID FOOD.   At 6:00pm: Complete steps 1 through 4 below, using ONE (1) 6-ounce bottle, before going to bed. Step 1:  Pour ONE (1) 6-ounce bottle of SUPREP liquid into the mixing container.  Step 2:  Add cool drinking water to the 16 ounce line on the container and mix.  Note: Dilute the solution concentrate as directed prior to use. Step 3:  DRINK ALL the liquid in the container. Step 4:  You MUST drink an additional two (2) or more 16 ounce containers of water over the next one (1) hour.   Continue clear liquids.  DAY OF PROCEDURE:   DATE: 10/05/2018   DAY: Wednesday If you take medications for your heart, blood pressure, or breathing, you may take these medications.   5 hours before your procedure at :5:30am Step 1:  Pour ONE (1) 6-ounce bottle of SUPREP liquid into the mixing container.  Step 2:  Add cool drinking water to the 16 ounce line on the container and mix.  Note: Dilute the solution concentrate as directed prior to use. Step 3:  DRINK ALL the liquid in the container. Step 4:   You MUST drink an additional two (2) or more 16 ounce containers of water over the next one (1) hour. You MUST complete the final glass of water at least 3 hours before your colonoscopy.   Nothing by mouth past 7:30am  You may take your morning medications with sip of water unless we have instructed otherwise.    Please see below for Dietary Information.  CLEAR LIQUIDS INCLUDE:  Water Jello (NOT red in color)   Ice Popsicles (NOT red in color)   Tea (sugar ok, no milk/cream) Powdered fruit flavored drinks  Coffee (sugar ok, no milk/cream) Gatorade/ Lemonade/ Kool-Aid  (NOT red in color)   Juice: apple, white grape, white cranberry Soft drinks  Clear  bullion, consomme, broth (fat free beef/chicken/vegetable)  Carbonated beverages (any kind)  Strained chicken noodle soup Hard Candy   Remember: Clear liquids are liquids that will allow you to see your fingers on the other side of a clear glass. Be sure liquids are NOT red in color, and not cloudy, but CLEAR.  DO NOT EAT OR DRINK ANY OF THE FOLLOWING:  Dairy products of any kind   Cranberry juice Tomato juice / V8 juice   Grapefruit juice Orange juice     Red grape juice  Do not eat any solid foods, including such foods as: cereal, oatmeal, yogurt, fruits, vegetables, creamed soups, eggs, bread, crackers, pureed foods in a blender, etc.   HELPFUL HINTS FOR DRINKING PREP SOLUTION:   Make sure prep is extremely cold. Mix and refrigerate the the morning of the prep. You may also put in the freezer.   You may try mixing some Crystal Light or Country Time Lemonade if you prefer. Mix in small amounts; add more if necessary.  Try drinking through a straw  Rinse mouth with water or a mouthwash between glasses, to remove after-taste.  Try sipping on a cold beverage /ice/ popsicles between glasses of prep.  Place a piece of sugar-free hard candy in mouth between glasses.  If you become nauseated, try consuming smaller amounts, or stretch  out the time between glasses. Stop for 30-60 minutes, then slowly start back drinking.     OTHER INSTRUCTIONS  You will need a responsible adult at least 49 years of age to accompany you and drive you home. This person must remain in the waiting room during your procedure. The hospital will cancel your procedure if you do not have a responsible adult with you.   1. Wear loose fitting clothing that is easily removed. 2. Leave jewelry and other valuables at home.  3. Remove all body piercing jewelry and leave at home. 4. Total time from sign-in until discharge is approximately 2-3 hours. 5. You should go home directly after your procedure and rest. You can resume normal activities the day after your procedure. 6. The day of your procedure you should not:  Drive  Make legal decisions  Operate machinery  Drink alcohol  Return to work   You may call the office (Dept: [phone]) before 5:00pm, or page the doctor on call [phone]) after 5:00pm, for further instructions, if necessary.   Insurance Information YOU WILL NEED TO CHECK WITH YOUR INSURANCE COMPANY FOR THE BENEFITS OF COVERAGE YOU HAVE FOR THIS PROCEDURE.  UNFORTUNATELY, NOT ALL INSURANCE COMPANIES HAVE BENEFITS TO COVER ALL OR PART OF THESE TYPES OF PROCEDURES.  IT IS YOUR RESPONSIBILITY TO CHECK YOUR BENEFITS, HOWEVER, WE WILL BE GLAD TO ASSIST YOU WITH ANY CODES YOUR INSURANCE COMPANY MAY NEED.    PLEASE NOTE THAT MOST INSURANCE COMPANIES WILL NOT COVER A SCREENING COLONOSCOPY FOR PEOPLE UNDER THE AGE OF 50  IF YOU HAVE BCBS INSURANCE, YOU MAY HAVE BENEFITS FOR A SCREENING COLONOSCOPY BUT IF POLYPS ARE FOUND THE DIAGNOSIS WILL CHANGE AND THEN YOU MAY HAVE A DEDUCTIBLE THAT WILL NEED TO BE MET. SO PLEASE MAKE SURE YOU CHECK YOUR BENEFITS FOR A SCREENING COLONOSCOPY AS WELL AS A DIAGNOSTIC COLONOSCOPY.

## 2018-05-05 ENCOUNTER — Other Ambulatory Visit (HOSPITAL_COMMUNITY): Payer: Self-pay | Admitting: Family Medicine

## 2018-05-05 DIAGNOSIS — Z1231 Encounter for screening mammogram for malignant neoplasm of breast: Secondary | ICD-10-CM

## 2018-05-16 ENCOUNTER — Other Ambulatory Visit (HOSPITAL_COMMUNITY): Payer: Self-pay | Admitting: Family Medicine

## 2018-05-16 ENCOUNTER — Ambulatory Visit (HOSPITAL_COMMUNITY)
Admission: RE | Admit: 2018-05-16 | Discharge: 2018-05-16 | Disposition: A | Discharge status: Home / Self Care | Payer: BC Managed Care – PPO | Source: Ambulatory Visit | Attending: Family Medicine | Admitting: Family Medicine

## 2018-05-16 DIAGNOSIS — Z1231 Encounter for screening mammogram for malignant neoplasm of breast: Secondary | ICD-10-CM | POA: Insufficient documentation

## 2018-07-11 NOTE — Progress Notes (Signed)
Pt's colonoscopy was re-scheduled due to COVID 19 and CDC guidelines.  Pt is aware that we are mailing her out new instructions.  Endo notified.

## 2018-09-26 ENCOUNTER — Telehealth: Payer: Self-pay | Admitting: *Deleted

## 2018-09-26 NOTE — Telephone Encounter (Signed)
Pt is scheduled for her COVID screening on 09/30/2018.  Pt is aware to remain in quarantine once testing is done.  Pt voiced understanding.   

## 2018-09-30 ENCOUNTER — Other Ambulatory Visit (HOSPITAL_COMMUNITY)
Admission: RE | Admit: 2018-09-30 | Discharge: 2018-09-30 | Disposition: A | Discharge status: Home / Self Care | Payer: BC Managed Care – PPO | Source: Ambulatory Visit | Attending: Internal Medicine | Admitting: Internal Medicine

## 2018-09-30 ENCOUNTER — Other Ambulatory Visit: Payer: Self-pay

## 2018-09-30 DIAGNOSIS — Z1159 Encounter for screening for other viral diseases: Secondary | ICD-10-CM | POA: Diagnosis not present

## 2018-10-02 LAB — NOVEL CORONAVIRUS, NAA (HOSP ORDER, SEND-OUT TO REF LAB; TAT 18-24 HRS): SARS-CoV-2, NAA: NOT DETECTED

## 2018-10-05 ENCOUNTER — Encounter (HOSPITAL_COMMUNITY): Payer: Self-pay

## 2018-10-05 ENCOUNTER — Encounter (HOSPITAL_COMMUNITY)
Admission: RE | Disposition: A | Discharge status: Home / Self Care | Payer: Self-pay | Source: Home / Self Care | Attending: Internal Medicine

## 2018-10-05 ENCOUNTER — Ambulatory Visit (HOSPITAL_COMMUNITY)
Admission: RE | Admit: 2018-10-05 | Discharge: 2018-10-05 | Disposition: A | Discharge status: Home / Self Care | Payer: BC Managed Care – PPO | Attending: Internal Medicine | Admitting: Internal Medicine

## 2018-10-05 ENCOUNTER — Other Ambulatory Visit: Payer: Self-pay

## 2018-10-05 DIAGNOSIS — Z87891 Personal history of nicotine dependence: Secondary | ICD-10-CM | POA: Insufficient documentation

## 2018-10-05 DIAGNOSIS — Z79899 Other long term (current) drug therapy: Secondary | ICD-10-CM | POA: Insufficient documentation

## 2018-10-05 DIAGNOSIS — K635 Polyp of colon: Secondary | ICD-10-CM | POA: Diagnosis not present

## 2018-10-05 DIAGNOSIS — Z8371 Family history of colonic polyps: Secondary | ICD-10-CM | POA: Insufficient documentation

## 2018-10-05 DIAGNOSIS — F329 Major depressive disorder, single episode, unspecified: Secondary | ICD-10-CM | POA: Insufficient documentation

## 2018-10-05 DIAGNOSIS — E785 Hyperlipidemia, unspecified: Secondary | ICD-10-CM | POA: Diagnosis not present

## 2018-10-05 DIAGNOSIS — Z1211 Encounter for screening for malignant neoplasm of colon: Secondary | ICD-10-CM | POA: Diagnosis not present

## 2018-10-05 HISTORY — PX: POLYPECTOMY: SHX5525

## 2018-10-05 HISTORY — PX: COLONOSCOPY: SHX5424

## 2018-10-05 HISTORY — DX: Depression, unspecified: F32.A

## 2018-10-05 SURGERY — COLONOSCOPY
Anesthesia: Moderate Sedation

## 2018-10-05 MED ORDER — MIDAZOLAM HCL 5 MG/5ML IJ SOLN
INTRAMUSCULAR | Status: AC
  Filled 2018-10-05: qty 10

## 2018-10-05 MED ORDER — STERILE WATER FOR IRRIGATION IR SOLN
Status: DC | PRN
  Administered 2018-10-05: 2.5 mL

## 2018-10-05 MED ORDER — ONDANSETRON HCL 4 MG/2ML IJ SOLN
INTRAMUSCULAR | Status: DC | PRN
  Administered 2018-10-05: 4 mg via INTRAVENOUS

## 2018-10-05 MED ORDER — ONDANSETRON HCL 4 MG/2ML IJ SOLN
INTRAMUSCULAR | Status: AC
  Filled 2018-10-05: qty 2

## 2018-10-05 MED ORDER — MIDAZOLAM HCL 5 MG/5ML IJ SOLN
INTRAMUSCULAR | Status: DC | PRN
  Administered 2018-10-05 (×2): 2 mg via INTRAVENOUS
  Administered 2018-10-05: 1 mg via INTRAVENOUS

## 2018-10-05 MED ORDER — SODIUM CHLORIDE 0.9 % IV SOLN
INTRAVENOUS | Status: DC
  Administered 2018-10-05: 10:00:00 via INTRAVENOUS

## 2018-10-05 MED ORDER — FENTANYL CITRATE (PF) 100 MCG/2ML IJ SOLN
INTRAMUSCULAR | Status: AC
  Filled 2018-10-05: qty 2

## 2018-10-05 MED ORDER — FENTANYL CITRATE (PF) 100 MCG/2ML IJ SOLN
INTRAMUSCULAR | Status: DC | PRN
  Administered 2018-10-05: 25 ug via INTRAVENOUS

## 2018-10-05 MED ORDER — MEPERIDINE HCL 50 MG/ML IJ SOLN
INTRAMUSCULAR | Status: AC
  Filled 2018-10-05: qty 1

## 2018-10-05 NOTE — H&P (Signed)
 @LOGO @   Primary Care Physician:  Assunta FoundGolding, John, MD Primary Gastroenterologist:  Dr. Jena Gaussourk  Pre-Procedure History & Physical: HPI:  Desiree Chen is a 48 y.o. female here for first-ever screening colonoscopy.  2 siblings with colonic resections for what sounds like at least precancerous polyps (youngest sibling 851 at time of diagnosis).  No bowel symptoms.  Past Medical History:  Diagnosis Date  . Depression   . Hyperlipidemia   . 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 ANAL FISTULOTOMY  2013  . APPENDECTOMY    . CESAREAN SECTION     x 2  . DILITATION & CURRETTAGE/HYSTROSCOPY WITH THERMACHOICE ABLATION N/A 12/14/2013   Procedure: DILATATION & CURETTAGE/HYSTEROSCOPY WITH THERMACHOICE ABLATION; TOTAL ABLATION THERAPY TIME 8 MINUTES 47 SECONDS; 86-88 DEG C;  Surgeon: Lazaro ArmsLuther H Eure, MD;  Location: [facility];  Service: Gynecology;  Laterality: N/A;    Prior to Admission medications   Medication Sig Start Date End Date Taking? Authorizing Provider  Cholecalciferol (VITAMIN D3) 125 MCG (5000 UT) CAPS Take 5,000 Units by mouth daily.    Yes [provider]  citalopram (CELEXA) 10 MG tablet Take 10 mg by mouth daily.    Yes [provider]  cyclobenzaprine (FLEXERIL) 10 MG tablet Take 10 mg by mouth at bedtime as needed for muscle spasms.    Yes [provider]  fluticasone (FLONASE) 50 MCG/ACT nasal spray Place 1 spray into both nostrils at bedtime.    Yes [provider]  MAGNESIUM-POTASSIUM PO Take 1 tablet by mouth daily as needed (constipation). MAG 07   Yes [provider]  pravastatin (PRAVACHOL) 20 MG tablet Take 20 mg by mouth at bedtime.  10/09/14  Yes [provider]  Propylene Glycol (SYSTANE BALANCE) 0.6 % SOLN Place 1 drop into both eyes 2 (two) times daily as needed (dry eyes).   Yes [provider]    Allergies as of 04/29/2018 - Review Complete 04/25/2018   Allergen Reaction Noted  . Demerol [meperidine] Other (See Comments) 10/16/2014  . Prednisone  04/25/2018  . Sulfa antibiotics Itching 07/13/2013  . Mucinex [guaifenesin er] Rash 04/25/2018    Family History  Problem Relation Age of Onset  . Cancer Father   . Stroke Mother     Social History   Socioeconomic History  . Marital status: Divorced    Spouse name: Not on file  . Number of children: Not on file  . Years of education: Not on file  . Highest education level: Not on file  Occupational History  . Not on file  Social Needs  . Financial resource strain: Not on file  . Food insecurity    Worry: Not on file    Inability: Not on file  . Transportation needs    Medical: Not on file    Non-medical: Not on file  Tobacco Use  . Smoking status: Former Smoker    Packs/day: 0.50    Years: 10.00    Pack years: 5.00    Quit date: 12/08/1996    Years since quitting: 21.8  . Smokeless tobacco: Never Used  Substance and Sexual Activity  . Alcohol use: No  . Drug use: No  . Sexual activity: Not Currently    Birth control/protection: None  Lifestyle  . Physical activity    Days per week: Not on file    Minutes per session: Not on file  . Stress: Not on  file  Relationships  . Social Herbalist on phone: Not on file    Gets together: Not on file    Attends religious service: Not on file    Active member of club or organization: Not on file    Attends meetings of clubs or organizations: Not on file    Relationship status: Not on file  . Intimate partner violence    Fear of current or ex partner: Not on file    Emotionally abused: Not on file    Physically abused: Not on file    Forced sexual activity: Not on file  Other Topics Concern  . Not on file  Social History Narrative  . Not on file    Review of Systems: See HPI, otherwise negative ROS  Physical Exam: BP 113/66   Pulse 67   Temp 98.6 F (37 C) (Oral)   Resp 16   Ht 5\' 2"  (1.575 m)   Wt  72.6 kg   SpO2 98%   BMI 29.26 kg/m  General:   Alert,  Well-developed, well-nourished, pleasant and cooperative in NAD Mouth:  No deformity or lesions. Neck:  Supple; no masses or thyromegaly. No significant cervical adenopathy. Lungs:  Clear throughout to auscultation.   No wheezes, crackles, or rhonchi. No acute distress. Heart:  Regular rate and rhythm; no murmurs, clicks, rubs,  or gallops. Abdomen: Non-distended, normal bowel sounds.  Soft and nontender without appreciable mass or hepatosplenomegaly.  Pulses:  Normal pulses noted. Extremities:  Without clubbing or edema.  Impression/Plan: 48 year old lady with a positive family history of at least precancerous colon polyps here for high risk rating colonoscopy-first ever examination.  The risks, benefits, limitations, alternatives and imponderables have been reviewed with the patient. Questions have been answered. All parties are agreeable.      Notice: This dictation was prepared with Dragon dictation along with smaller phrase technology. Any transcriptional errors that result from this process are unintentional and may not be corrected upon review.

## 2018-10-05 NOTE — Op Note (Signed)
[facility] Patient Name: Desiree Chen Procedure Date: 10/05/2018 10:11 AM MRN: [id number] Date of Birth: 12-Oct-1970 Attending MD: Norvel Richards , MD CSN: [id number] Age: 48 Admit Type: Outpatient Procedure:                Colonoscopy Indications:              Colon cancer screening in patient with 1st-degree                            relative having advanced adenoma of the colon Providers:                Norvel Richards, MD, Otis Peak B. Gwenlyn Perking RN, RN,                            Nelma Rothman, Technician Referring MD:              Medicines:                Midazolam 5 mg IV, Fentanyl 25 micrograms IV,                            Ondansetron 4 mg IV Complications:            No immediate complications. Estimated Blood Loss:     Estimated blood loss was minimal. Procedure:                Pre-Anesthesia Assessment:                           - Prior to the procedure, a History and Physical                            was performed, and patient medications and                            allergies were reviewed. The patient's tolerance of                            previous anesthesia was also reviewed. The risks                            and benefits of the procedure and the sedation                            options and risks were discussed with the patient.                            All questions were answered, and informed consent                            was obtained. Prior Anticoagulants: The patient has                            taken no previous anticoagulant or antiplatelet  agents. ASA Grade Assessment: II - A patient with                            mild systemic disease. After reviewing the risks                            and benefits, the patient was deemed in                            satisfactory condition to undergo the procedure.                           After obtaining informed consent, the colonoscope                             was passed under direct vision. Throughout the                            procedure, the patient's blood pressure, pulse, and                            oxygen saturations were monitored continuously. The                            CF-[id number] ([id number] scope was introduced through                            the anus and advanced to the 5 cm into the ileum.                            The colonoscopy was performed without difficulty.                            The patient tolerated the procedure well. The                            quality of the bowel preparation was adequate. The                            terminal ileum, ileocecal valve, appendiceal                            orifice, and rectum were photographed. The entire                            colon was well visualized. Scope In: 10:29:31 AM Scope Out: 10:41:14 AM Scope Withdrawal Time: 0 hours 9 minutes 16 seconds  Total Procedure Duration: 0 hours 11 minutes 43 seconds  Findings:      The perianal and digital rectal examinations were normal.      A 5 mm polyp was found in the sigmoid colon. The polyp was       semi-pedunculated. The polyp was removed with a cold snare. Resection       and retrieval were complete.  Estimated blood loss was minimal.       Lipomatous appearing ileocecal valve. Positive pillow sign. Distal 5 cm       of terminal ileum appeared normal. Rectal mucosa seen well on?"face.       Rectal vault too small to retroflex. Rectal mucosa appeared normal. Impression:               - One 5 mm polyp in the sigmoid colon, removed with                            a cold snare. Resected and retrieved. Moderate Sedation:      Moderate (conscious) sedation was administered by the endoscopy nurse       and supervised by the endoscopist. The following parameters were       monitored: oxygen saturation, heart rate, blood pressure, respiratory       rate, EKG, adequacy of pulmonary ventilation, and response to  care. Recommendation:           - Repeat colonoscopy date to be determined after                            pending pathology results are reviewed for                            surveillance.                           - Return to GI clinic [Return Day]. Procedure Code(s):        --- Professional ---                           [phone], Colonoscopy, flexible; with removal of                            tumor(s), polyp(s), or other lesion(s) by snare                            technique Diagnosis Code(s):        --- Professional ---                           Z83.71, Family history of colonic polyps                           K63.5, Polyp of colon CPT copyright 2019 American [employer] Association. All rights reserved. The codes documented in this report are preliminary and upon coder review may  be revised to meet current compliance requirements. Gerrit Friendsobert M. Salaam Battershell, MD Desiree Chen Desiree Campise, MD 10/05/2018 10:50:41 AM This report has been signed electronically. Number of Addenda: 0

## 2018-10-05 NOTE — Discharge Instructions (Signed)
Colonoscopy Discharge Instructions  Read the instructions outlined below and refer to this sheet in the next few weeks. These discharge instructions provide you with general information on caring for yourself after you leave the hospital. Your doctor may also give you specific instructions. While your treatment has been planned according to the most current medical practices available, unavoidable complications occasionally occur. If you have any problems or questions after discharge, call Dr. Gala Romney at [phone]. ACTIVITY  You may resume your regular activity, but move at a slower pace for the next 24 hours.   Take frequent rest periods for the next 24 hours.   Walking will help get rid of the air and reduce the bloated feeling in your belly (abdomen).   No driving for 24 hours (because of the medicine (anesthesia) used during the test).    Do not sign any important legal documents or operate any machinery for 24 hours (because of the anesthesia used during the test).  NUTRITION  Drink plenty of fluids.   You may resume your normal diet as instructed by your doctor.   Begin with a light meal and progress to your normal diet. Heavy or fried foods are harder to digest and may make you feel sick to your stomach (nauseated).   Avoid alcoholic beverages for 24 hours or as instructed.  MEDICATIONS  You may resume your normal medications unless your doctor tells you otherwise.  WHAT YOU CAN EXPECT TODAY  Some feelings of bloating in the abdomen.   Passage of more gas than usual.   Spotting of blood in your stool or on the toilet paper.  IF YOU HAD POLYPS REMOVED DURING THE COLONOSCOPY:  No aspirin products for 7 days or as instructed.   No alcohol for 7 days or as instructed.   Eat a soft diet for the next 24 hours.  FINDING OUT THE RESULTS OF YOUR TEST Not all test results are available during your visit. If your test results are not back during the visit, make an appointment  with your caregiver to find out the results. Do not assume everything is normal if you have not heard from your caregiver or the medical facility. It is important for you to follow up on all of your test results.  SEEK IMMEDIATE MEDICAL ATTENTION IF:  You have more than a spotting of blood in your stool.   Your belly is swollen (abdominal distention).   You are nauseated or vomiting.   You have a temperature over 101.   You have abdominal pain or discomfort that is severe or gets worse throughout the day.    Colon Polyps  Polyps are tissue growths inside the body. Polyps can grow in many places, including the large intestine (colon). A polyp may be a round bump or a mushroom-shaped growth. You could have one polyp or several. Most colon polyps are noncancerous (benign). However, some colon polyps can become cancerous over time. Finding and removing the polyps early can help prevent this. What are the causes? The exact cause of colon polyps is not known. What increases the risk? You are more likely to develop this condition if you:  Have a family history of colon cancer or colon polyps.  Are older than 94 or older than 45 if you are African American.  Have inflammatory bowel disease, such as ulcerative colitis or Crohn's disease.  Have certain hereditary conditions, such as: ? Familial adenomatous polyposis. ? Lynch syndrome. ? Turcot syndrome. ? Peutz-Jeghers syndrome.  Are  overweight.  Smoke cigarettes.  Do not get enough exercise.  Drink too much alcohol.  Eat a diet that is high in fat and red meat and low in fiber.  Had childhood cancer that was treated with abdominal radiation. What are the signs or symptoms? Most polyps do not cause symptoms. If you have symptoms, they may include:  Blood coming from your rectum when having a bowel movement.  Blood in your stool. The stool may look dark red or black.  Abdominal pain.  A change in bowel habits, such as  constipation or diarrhea. How is this diagnosed? This condition is diagnosed with a colonoscopy. This is a procedure in which a lighted, flexible scope is inserted into the anus and then passed into the colon to examine the area. Polyps are sometimes found when a colonoscopy is done as part of routine cancer screening tests. How is this treated? Treatment for this condition involves removing any polyps that are found. Most polyps can be removed during a colonoscopy. Those polyps will then be tested for cancer. Additional treatment may be needed depending on the results of testing. Follow these instructions at home: Lifestyle  Maintain a healthy weight, or lose weight if recommended by your health care provider.  Exercise every day or as told by your health care provider.  Do not use any products that contain nicotine or tobacco, such as cigarettes and e-cigarettes. If you need help quitting, ask your health care provider.  If you drink alcohol, limit how much you have: ? 0-1 drink a day for women. ? 0-2 drinks a day for men.  Be aware of how much alcohol is in your drink. In the [place] one drink equals one 12 oz bottle of beer (355 mL), one 5 oz glass of wine (148 mL), or one 1 oz shot of hard liquor (44 mL). Eating and drinking   Eat foods that are high in fiber, such as fruits, vegetables, and whole grains.  Eat foods that are high in calcium and vitamin D, such as milk, cheese, yogurt, eggs, liver, fish, and broccoli.  Limit foods that are high in fat, such as fried foods and desserts.  Limit the amount of red meat and processed meat you eat, such as hot dogs, sausage, bacon, and lunch meats. General instructions  Keep all follow-up visits as told by your health care provider. This is important. ? This includes having regularly scheduled colonoscopies. ? Talk to your health care provider about when you need a colonoscopy. Contact a health care provider if:  You have new or  worsening bleeding during a bowel movement.  You have new or increased blood in your stool.  You have a change in bowel habits.  You lose weight for no known reason. Summary  Polyps are tissue growths inside the body. Polyps can grow in many places, including the colon.  Most colon polyps are noncancerous (benign), but some can become cancerous over time.  This condition is diagnosed with a colonoscopy.  Treatment for this condition involves removing any polyps that are found. Most polyps can be removed during a colonoscopy. This information is not intended to replace advice given to you by your health care provider. Make sure you discuss any questions you have with your health care provider. Document Released: 12/25/2003 Document Revised: 07/15/2017 Document Reviewed: 07/15/2017 Elsevier Interactive Patient Education  2019 [employer].   Colon polyp information provided  Further recommendations to follow pending review of pathology report  I called  Zoe Priddy, patient's daughter,  at patient request.  No answer.  Left message on voicemail.

## 2018-10-07 ENCOUNTER — Encounter: Payer: Self-pay | Admitting: Internal Medicine

## 2018-10-11 ENCOUNTER — Encounter (HOSPITAL_COMMUNITY): Payer: Self-pay | Admitting: Internal Medicine

## 2019-05-03 ENCOUNTER — Other Ambulatory Visit (HOSPITAL_COMMUNITY): Payer: Self-pay | Admitting: Family Medicine

## 2019-05-03 DIAGNOSIS — Z1231 Encounter for screening mammogram for malignant neoplasm of breast: Secondary | ICD-10-CM

## 2019-05-22 ENCOUNTER — Ambulatory Visit (HOSPITAL_COMMUNITY)
Admission: RE | Admit: 2019-05-22 | Discharge: 2019-05-22 | Disposition: A | Discharge status: Home / Self Care | Payer: BC Managed Care – PPO | Source: Ambulatory Visit | Attending: Family Medicine | Admitting: Family Medicine

## 2019-05-22 ENCOUNTER — Other Ambulatory Visit: Payer: Self-pay

## 2019-05-22 DIAGNOSIS — Z1231 Encounter for screening mammogram for malignant neoplasm of breast: Secondary | ICD-10-CM | POA: Insufficient documentation

## 2020-04-23 ENCOUNTER — Other Ambulatory Visit (HOSPITAL_COMMUNITY): Payer: Self-pay | Admitting: Family Medicine

## 2020-04-23 DIAGNOSIS — Z1231 Encounter for screening mammogram for malignant neoplasm of breast: Secondary | ICD-10-CM

## 2020-05-27 ENCOUNTER — Other Ambulatory Visit: Payer: Self-pay

## 2020-05-27 ENCOUNTER — Ambulatory Visit (HOSPITAL_COMMUNITY)
Admission: RE | Admit: 2020-05-27 | Discharge: 2020-05-27 | Disposition: A | Discharge status: Home / Self Care | Payer: BC Managed Care – PPO | Source: Ambulatory Visit | Attending: Family Medicine | Admitting: Family Medicine

## 2020-05-27 DIAGNOSIS — Z1231 Encounter for screening mammogram for malignant neoplasm of breast: Secondary | ICD-10-CM | POA: Diagnosis not present

## 2020-10-01 ENCOUNTER — Encounter: Payer: Self-pay | Admitting: Surgery

## 2020-10-01 ENCOUNTER — Ambulatory Visit: Payer: Self-pay | Admitting: Surgery

## 2020-10-01 DIAGNOSIS — Z8371 Family history of colonic polyps: Secondary | ICD-10-CM | POA: Insufficient documentation

## 2020-10-01 DIAGNOSIS — E785 Hyperlipidemia, unspecified: Secondary | ICD-10-CM | POA: Insufficient documentation

## 2020-10-01 DIAGNOSIS — Z83719 Family history of colon polyps, unspecified: Secondary | ICD-10-CM | POA: Insufficient documentation

## 2020-10-01 DIAGNOSIS — F32A Depression, unspecified: Secondary | ICD-10-CM | POA: Insufficient documentation

## 2020-10-01 DIAGNOSIS — R87620 Atypical squamous cells of undetermined significance on cytologic smear of vagina (ASC-US): Secondary | ICD-10-CM | POA: Insufficient documentation

## 2020-10-01 DIAGNOSIS — K635 Polyp of colon: Secondary | ICD-10-CM | POA: Insufficient documentation

## 2020-10-01 NOTE — H&P (Signed)
Desiree Chen Appointment: 10/01/2020 9:30 AM Location: [facility] Patient #: [id number] DOB: Aug 21, 1970 Divorced / Language: [place] / Race: White Female   History of Present Illness Desiree Sportsman MD; 10/01/2020 9:58 AM) The patient is a 50 year old female who presents with hemorrhoids. Note for "Hemorrhoids": ` ` ` Patient sent for surgical consultation at the request of Dr Desiree Chen  Chief Complaint: Worsening hemorrhoids ` ` The patient is a pleasant woman that been struggling with hemorrhoids since she was pregnant with her daughter 24 years ago.  She never gets any severe pain to gets intermittent bleeding.  Usually can manage it with controlling some other stools and constipation.  There is starting to bother her more over the past few years.  She has added milk of magnesia to help soften her bowels.  She goes 1 or 2 times a day.  However she's having more bleeding events in some leaking discomfort.  She wish to consider doing something more aggressively.  She has a family history of colon polyps and had a colonoscopy in 2020 that revealed only a hyperplastic polyp by Desiree Chen.  Five-year follow-up recommended.  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 Ulice Chen up in [place] in 2011.  Dr. Malvin Chen is since retired.  Given the worsening hemorrhoid concerns, surgical consultation offered.  Patient comes today by herself.  She has to wear a pad.  Can be uncomfortable and unsightly.  No diabetes or sleep apnea.  She is nonicteric blood thinners.  She has mild hypercholesterolemia.  She can walk a couple miles without any difficulty.  She had a couple C-sections as well as an appendectomy but no other abdominal surgeries.   No personal nor family history of inflammatory bowel disease, irritable bowel syndrome, allergy such as Celiac Sprue, dietary/dairy problems, colitis, ulcers nor gastritis.  No recent sick  contacts/gastroenteritis.  No travel outside the country.  No changes in diet.  No dysphagia to solids or liquids.  No significant heartburn or reflux.  No melena, hematemesis, coffee ground emesis.  No evidence of prior gastric/peptic ulceration.  (Review of systems as stated in this history (HPI) or in the review of systems.  Otherwise all other 12 point ROS are negative) ` ` ###########################################`  This patient encounter took 30 minutes today to perform the following: obtain history, perform exam, review outside records, interpret tests & imaging, counsel the patient on their diagnosis; and, document this encounter, including findings & plan in the electronic health record (EHR).   Past Surgical History Desiree Chen, CMA; 10/01/2020 9:25 AM) Anal Fissure Repair   Appendectomy   Cataract Surgery   Bilateral. Cesarean Section - Multiple   Colon Polyp Removal - Colonoscopy    Diagnostic Studies History Desiree Chen, CMA; 10/01/2020 9:25 AM) Colonoscopy   1-5 years ago Mammogram   within last year Pap Smear   1-5 years ago  Allergies Desiree Chen, CMA; 10/01/2020 9:26 AM) Sulfa Drugs    Medication History (Desiree Chen, CMA; 10/01/2020 9:28 AM) Citalopram Hydrobromide  (20MG  Tablet, Oral) Active. Estradiol  (1MG  Tablet, Oral) Active. Pravastatin Sodium  (20MG  Tablet, Oral) Active. Vitamin D-3  (125 MCG(5000 UT) Tablet, Oral) Active. Vitamin C  (500MG  Tablet, Oral) Active. Fish Oil  (300MG  Capsule, Oral) Active. Flaxseed  Active. Medications Reconciled   Social History , CMA; 10/01/2020 9:25 AM) Alcohol use   Occasional alcohol use. Caffeine use   Coffee. No drug use   Tobacco  use   Former smoker.  Family History Desiree Chen, CMA; 10/01/2020 9:25 AM) Cancer   Father. Cerebrovascular Accident   Mother. Colon Polyps   Sister. Heart disease in female family member before age 48   Hypertension   Mother. Migraine Headache    Sister. Thyroid problems   Mother.  Pregnancy / Birth History Desiree Chen, CMA; 10/01/2020 9:25 AM) Age at menarche   14 years. Contraceptive History   Oral contraceptives. Gravida   2 Irregular periods   Length (months) of breastfeeding   12-24 Maternal age   75-25 Para   2  Other Problems Desiree Chen, CMA; 10/01/2020 9:25 AM) Back Pain   Hemorrhoids   Hypercholesterolemia      Review of Systems (Desiree Chen CMA; 10/01/2020 9:25 AM) General Not Present- Appetite Loss, Chills, Fatigue, Fever, Night Sweats, Weight Gain and Weight Loss. Skin Not Present- Change in Wart/Mole, Dryness, Hives, Jaundice, New Lesions, Non-Healing Wounds, Rash and Ulcer. HEENT Not Present- Earache, Hearing Loss, Hoarseness, Nose Bleed, Oral Ulcers, Ringing in the Ears, Seasonal Allergies, Sinus Pain, Sore Throat, Visual Disturbances, Wears glasses/contact lenses and Yellow Eyes. Respiratory Not Present- Bloody sputum, Chronic Cough, Difficulty Breathing, Snoring and Wheezing. Breast Not Present- Breast Mass, Breast Pain, Nipple Discharge and Skin Changes. Cardiovascular Not Present- Chest Pain, Difficulty Breathing Lying Down, Leg Cramps, Palpitations, Rapid Heart Rate, Shortness of Breath and Swelling of Extremities. Gastrointestinal Present- Bloody Stool, Excessive gas and Hemorrhoids. Not Present- Abdominal Pain, Bloating, Change in Bowel Habits, Chronic diarrhea, Constipation, Difficulty Swallowing, Gets full quickly at meals, Indigestion, Nausea, Rectal Pain and Vomiting. Female Genitourinary Not Present- Frequency, Nocturia, Painful Urination, Pelvic Pain and Urgency. Musculoskeletal Not Present- Back Pain, Joint Pain, Joint Stiffness, Muscle Pain, Muscle Weakness and Swelling of Extremities. Neurological Not Present- Decreased Memory, Fainting, Headaches, Numbness, Seizures, Tingling, Tremor, Trouble walking and Weakness. Psychiatric Not Present- Anxiety, Bipolar, Change in Sleep Pattern,  Depression, Fearful and Frequent crying. Endocrine Not Present- Cold Intolerance, Excessive Hunger, Hair Changes, Heat Intolerance, Hot flashes and New Diabetes. Hematology Not Present- Blood Thinners, Easy Bruising, Excessive bleeding, Gland problems, HIV and Persistent Infections.  Vitals (Desiree Chen CMA; 10/01/2020 9:26 AM) 10/01/2020 9:26 AM Weight: 166.4 lb   Height: 62 in  Body Surface Area: 1.77 m   Body Mass Index: 30.43 kg/m   Pulse: 66 (Regular)    BP: 110/68(Sitting, Left Arm, Standard)       Physical Exam Desiree Sportsman MD; 10/01/2020 9:43 AM) General Mental Status - Alert. General Appearance - Not in acute distress, Not Sickly. Orientation - Oriented X3. Hydration - Well hydrated. Voice - Normal.  Integumentary Global Assessment Upon inspection and palpation of skin surfaces of the - Axillae: non-tender, no inflammation or ulceration, no drainage. and Distribution of scalp and body hair is normal. General Characteristics Temperature - normal warmth is noted.  Head and Neck Head - normocephalic, atraumatic with no lesions or palpable masses. Face Global Assessment - atraumatic, no absence of expression. Neck Global Assessment - no abnormal movements, no bruit auscultated on the right, no bruit auscultated on the left, no decreased range of motion, non-tender. Trachea - midline. Thyroid Gland Characteristics - non-tender.  Eye Eyeball - Left - Extraocular movements intact, No Nystagmus - Left. Eyeball - Right - Extraocular movements intact, No Nystagmus - Right. Cornea - Left - No Hazy - Left. Cornea - Right - No Hazy - Right. Sclera/Conjunctiva - Left - No scleral icterus, No Discharge - Left. Sclera/Conjunctiva - Right - No scleral icterus,  No Discharge - Right. Pupil - Left - Direct reaction to light normal. Pupil - Right - Direct reaction to light normal.  ENMT Ears Pinna - Left - no drainage observed, no generalized tenderness observed. Pinna  - Right - no drainage observed, no generalized tenderness observed. Nose and Sinuses External Inspection of the Nose - no destructive lesion observed. Inspection of the nares - Left - quiet respiration. Inspection of the nares - Right - quiet respiration. Mouth and Throat Lips - Upper Lip - no fissures observed, no pallor noted. Lower Lip - no fissures observed, no pallor noted. Nasopharynx - no discharge present. Oral Cavity/Oropharynx - Tongue - no dryness observed. Oral Mucosa - no cyanosis observed. Hypopharynx - no evidence of airway distress observed.  Chest and Lung Exam Inspection Movements - Normal and Symmetrical. Accessory muscles - No use of accessory muscles in breathing. Palpation Palpation of the chest reveals - Non-tender. Auscultation Breath sounds - Normal and Clear.  Cardiovascular Auscultation Rhythm - Regular. Murmurs & Other Heart Sounds - Auscultation of the heart reveals - No Murmurs and No Systolic Clicks.  Abdomen Inspection Inspection of the abdomen reveals - No Visible peristalsis and No Abnormal pulsations. Umbilicus - No Bleeding, No Urine drainage. Palpation/Percussion Palpation and Percussion of the abdomen reveal - Soft, Non Tender, No Rebound tenderness, No Rigidity (guarding) and No Cutaneous hyperesthesia. Note:  Abdomen soft.  Not severely distended.  Moderate suprapubic umbilical diastases recti. To centimeter umbilical mass reduces down to a umbilical hernia. No pain or guarding. Right lower quadrant oblique incision consistent with prior appendectomy. No hernia.   Female Genitourinary Sexual Maturity Tanner 5 - Adult hair pattern. Note:  No inguinal hernia or lymphadenopathy. Pfannenstiel incision closed without hernia. No vaginal bleeding nor discharge   Rectal Note:  #####################################  Right posterior and left lateral chronically prolapsed hemorrhoids mainly external hemorrhoids.  Some thinning out right posterior  consistent with prior fistulotomy  Perianal skin clean with good hygiene.  No pruritis ani.  No pilonidal disease.  No fissure.  No abscess/fistula.  Normal sphincter tone.   No condyloma warts.  Tolerates digital and anoscopic rectal exam. No rectal masses.  Fullness of left lateral internal hemorrhoidal pile.  Right anterior grade 2.  Right posterior probably at least grade 3 internal only with significant external component.  Exam done with assistance of female Medical Assistant in the room.   Peripheral Vascular Upper Extremity Inspection - Left - No Cyanotic nailbeds - Left, Not Ischemic. Inspection - Right - No Cyanotic nailbeds - Right, Not Ischemic.  Neurologic Neurologic evaluation reveals  - normal attention span and ability to concentrate, able to name objects and repeat phrases. Appropriate fund of knowledge , normal sensation and normal coordination. Mental Status Affect - not angry, not paranoid. Cranial Nerves - Normal Bilaterally. Gait - Normal.  Neuropsychiatric Mental status exam performed with findings of - able to articulate well with normal speech/language, rate, volume and coherence, thought content normal with ability to perform basic computations and apply abstract reasoning and no evidence of hallucinations, delusions, obsessions or homicidal/suicidal ideation.  Musculoskeletal Global Assessment Spine, Ribs and Pelvis - no instability, subluxation or laxity. Right Upper Extremity - no instability, subluxation or laxity.  Lymphatic Head & Neck  General Head & Neck Lymphatics: Bilateral - Description - No Localized lymphadenopathy. Axillary  General Axillary Region: Bilateral - Description - No Localized lymphadenopathy. Femoral & Inguinal  Generalized Femoral & Inguinal Lymphatics: Left - Description - No Localized lymphadenopathy. Right -  Description - No Localized lymphadenopathy.   Results Desiree Chen(Yamin Swingler C. Isaak Delmundo MD; 10/01/2020 9:56  AM) Procedures  Name Value Date Hemorrhoids Procedure   Anal exam: External Hemorrhoid   Internal exam: Internal Hemorroids ( non-bleeding)   Other: #####################################...........Marland Kitchen.Right posterior and left lateral chronically prolapsed hemorrhoids mainly external hemorrhoids.  Some thinning out right posterior consistent with prior fistulotomy...........Marland Kitchen.Perianal skin clean with good hygiene.  No pruritis ani.  No pilonidal disease.  No fissure.  No abscess/fistula.  Normal sphincter tone.  ...... No condyloma warts............Marland Kitchen.Tolerates digital and anoscopic rectal exam. No rectal masses.  Fullness of left lateral internal hemorrhoidal pile.  Right anterior grade 2.  Right posterior probably at least grade 3 internal only with significant external component.  Exam done with assistance of female Medical Assistant in the room.  Performed: 10/01/2020 9:43 AM    Assessment & Plan Desiree Chen(Dayne Dekay C. Ariah Mower MD; 10/01/2020 9:56 AM) EXTERNAL HEMORRHOIDS WITH COMPLICATION (K64.4) Impression: Large external hemorrhoids 2 piles with bleeding and discomfort  I think this requires surgical removal. Should be outpatient surgery with hemorrhoidal ligation of pexy of internal hemorrhoids and excision of external large hemorrhoids as well.  She is interested in proceeding but is switching jobs early July. She wants to try and do in December over Winter break 2022 where she can get time off Chen little more easily. I recommend she call us in the early fall to make sure that we have time to get this scheduled at the most optimum time. Related to our office as well to get the ball rolling on setting things up. Current Plans Pt Education - [facility] Hemorrhoids (Phillipe Clemon): discussed with patient and provided information. ANOSCOPY, DIAGNOSTIC ([id number]) Pt Education - Pamphlet Given - The Hemorrhoid Book: discussed with patient and provided information. PROLAPSED INTERNAL HEMORRHOIDS, GRADE 4  (K64.3) PROLAPSED INTERNAL HEMORRHOIDS, GRADE 3 (K64.2) ENCOUNTER FOR PREOPERATIVE EXAMINATION FOR GENERAL SURGICAL PROCEDURE (Z01.818) Current Plans You are being scheduled for surgery - Our schedulers will call you.   You should hear from our office's scheduling department within 5 working days about the location, date, and time of surgery.  We try to make accommodations for patient's preferences in scheduling surgery, but sometimes the OR schedule or the surgeon's schedule prevents us from making those accommodations.  If you have not heard from our office [phone]) in 5 working days, call the office and ask for your surgeon's nurse.  If you have other questions about your diagnosis, plan, or surgery, call the office and ask for your surgeon's nurse.  The anatomy & physiology of the anorectal region was discussed.  The pathophysiology of hemorrhoids and differential diagnosis was discussed.  Natural history risks without surgery was discussed.   I stressed the importance of a bowel regimen to have daily soft bowel movements to minimize progression of disease.  Interventions such as sclerotherapy & banding were discussed.  The patient's symptoms are not adequately controlled by medicines and other non-operative treatments.  I feel the risks & problems of no surgery outweigh the operative risks; therefore, I recommended surgery to treat the hemorrhoids by ligation, pexy, and possible resection.   Risks such as bleeding, infection, urinary difficulties, need for further treatment, heart attack, death, and other risks were discussed.   I noted a good likelihood this will help address the problem.  Goals of post-operative recovery were discussed as well.  Possibility that this will not correct all symptoms was explained.  Post-operative pain, bleeding, constipation, and other problems after surgery were discussed.  We  will Chen to minimize complications.   Educational handouts further explaining  the pathology, treatment options, and bowel regimen were given as well.  Questions were answered.  The patient expresses understanding & wishes to proceed with surgery.  Pt Education - [facility] Rectal Prep for Anorectal outpatient/office surgery: discussed with patient and provided information. Pt Education - [facility] Rectal Surgery HCI (Syon Tews): discussed with patient and provided information. 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 Five-year follow-up equals 2025. Defer to Dr. Jena Gauss with gastroenterology. UMBILICAL HERNIA WITHOUT OBSTRUCTION AND WITHOUT GANGRENE (K42.9) Impression: Small umbilical hernia reducible and asymptomatic. I would hold off on any surgical intervention at this time. She agrees. Should he get larger or be bothersome, can reconsider surgery but I would not do the same time as hemorrhoid surgery    Signed electronically by Desiree Sportsman, MD (10/01/2020 9:58 AM)  Desiree Sportsman, MD, FACS, MASCRS Esophageal, Gastrointestinal & Colorectal Surgery Robotic and Minimally Invasive Surgery  [facility] [address] [place], [place] [address] [phone] Fax [phone] Main/Paging  CONTACT INFORMATION: Weekday (9AM-5PM) concerns: Call [facility] main office at [phone] Weeknight (5PM-9AM) or Weekend/Holiday concerns: Check [email or web address] for General Surgery [facility] coverage (Please, do not use SecureChat as it is not reliable communication to operating surgeons for immediate patient care)   '

## 2021-04-24 ENCOUNTER — Other Ambulatory Visit (HOSPITAL_COMMUNITY): Payer: Self-pay | Admitting: Family Medicine

## 2021-04-24 DIAGNOSIS — Z1231 Encounter for screening mammogram for malignant neoplasm of breast: Secondary | ICD-10-CM

## 2021-04-25 ENCOUNTER — Encounter: Payer: Self-pay | Admitting: Internal Medicine

## 2021-06-02 ENCOUNTER — Ambulatory Visit (HOSPITAL_COMMUNITY)
Admission: RE | Admit: 2021-06-02 | Discharge: 2021-06-02 | Disposition: A | Discharge status: Home / Self Care | Payer: BC Managed Care – PPO | Source: Ambulatory Visit | Attending: Family Medicine | Admitting: Family Medicine

## 2021-06-02 ENCOUNTER — Other Ambulatory Visit: Payer: Self-pay

## 2021-06-02 DIAGNOSIS — Z1231 Encounter for screening mammogram for malignant neoplasm of breast: Secondary | ICD-10-CM | POA: Insufficient documentation

## 2021-08-20 ENCOUNTER — Ambulatory Visit: Payer: BC Managed Care – PPO | Admitting: Gastroenterology

## 2021-09-16 ENCOUNTER — Ambulatory Visit (HOSPITAL_COMMUNITY)
Admission: RE | Admit: 2021-09-16 | Discharge: 2021-09-16 | Disposition: A | Discharge status: Home / Self Care | Payer: BC Managed Care – PPO | Source: Ambulatory Visit | Attending: Internal Medicine | Admitting: Internal Medicine

## 2021-09-16 ENCOUNTER — Other Ambulatory Visit (HOSPITAL_COMMUNITY): Payer: Self-pay | Admitting: Internal Medicine

## 2021-09-16 DIAGNOSIS — R059 Cough, unspecified: Secondary | ICD-10-CM | POA: Diagnosis present

## 2022-06-01 ENCOUNTER — Other Ambulatory Visit (HOSPITAL_COMMUNITY): Payer: Self-pay | Admitting: Family Medicine

## 2022-06-01 DIAGNOSIS — Z1231 Encounter for screening mammogram for malignant neoplasm of breast: Secondary | ICD-10-CM

## 2022-06-10 ENCOUNTER — Inpatient Hospital Stay (HOSPITAL_COMMUNITY): Admission: RE | Admit: 2022-06-10 | Payer: BC Managed Care – PPO | Source: Ambulatory Visit

## 2022-06-10 DIAGNOSIS — Z1231 Encounter for screening mammogram for malignant neoplasm of breast: Secondary | ICD-10-CM

## 2022-06-17 ENCOUNTER — Ambulatory Visit (HOSPITAL_COMMUNITY)
Admission: RE | Admit: 2022-06-17 | Discharge: 2022-06-17 | Disposition: A | Discharge status: Home / Self Care | Payer: BC Managed Care – PPO | Source: Ambulatory Visit | Attending: Family Medicine | Admitting: Family Medicine

## 2022-06-17 ENCOUNTER — Encounter (HOSPITAL_COMMUNITY): Payer: Self-pay

## 2022-06-17 DIAGNOSIS — Z1231 Encounter for screening mammogram for malignant neoplasm of breast: Secondary | ICD-10-CM | POA: Diagnosis not present

## 2023-01-07 ENCOUNTER — Other Ambulatory Visit: Payer: Self-pay

## 2023-01-07 ENCOUNTER — Encounter (HOSPITAL_BASED_OUTPATIENT_CLINIC_OR_DEPARTMENT_OTHER): Payer: Self-pay | Admitting: Orthopedic Surgery

## 2023-01-13 ENCOUNTER — Other Ambulatory Visit: Payer: Self-pay

## 2023-01-13 ENCOUNTER — Ambulatory Visit (HOSPITAL_BASED_OUTPATIENT_CLINIC_OR_DEPARTMENT_OTHER): Payer: BC Managed Care – PPO | Admitting: Anesthesiology

## 2023-01-13 ENCOUNTER — Encounter (HOSPITAL_BASED_OUTPATIENT_CLINIC_OR_DEPARTMENT_OTHER)
Admission: RE | Disposition: A | Discharge status: Home / Self Care | Payer: Self-pay | Source: Home / Self Care | Attending: Orthopedic Surgery

## 2023-01-13 ENCOUNTER — Encounter (HOSPITAL_BASED_OUTPATIENT_CLINIC_OR_DEPARTMENT_OTHER): Payer: Self-pay | Admitting: Orthopedic Surgery

## 2023-01-13 ENCOUNTER — Ambulatory Visit (HOSPITAL_BASED_OUTPATIENT_CLINIC_OR_DEPARTMENT_OTHER)
Admission: RE | Admit: 2023-01-13 | Discharge: 2023-01-13 | Disposition: A | Discharge status: Home / Self Care | Payer: BC Managed Care – PPO | Attending: Orthopedic Surgery | Admitting: Orthopedic Surgery

## 2023-01-13 DIAGNOSIS — F32A Depression, unspecified: Secondary | ICD-10-CM | POA: Diagnosis not present

## 2023-01-13 DIAGNOSIS — M67431 Ganglion, right wrist: Secondary | ICD-10-CM | POA: Diagnosis present

## 2023-01-13 DIAGNOSIS — Z87891 Personal history of nicotine dependence: Secondary | ICD-10-CM | POA: Diagnosis not present

## 2023-01-13 HISTORY — PX: CYST EXCISION: SHX5701

## 2023-01-13 SURGERY — CYST REMOVAL
Anesthesia: Monitor Anesthesia Care | Site: Wrist | Laterality: Right

## 2023-01-13 MED ORDER — ONDANSETRON HCL 4 MG/2ML IJ SOLN
INTRAMUSCULAR | Status: DC | PRN
  Administered 2023-01-13: 4 mg via INTRAVENOUS

## 2023-01-13 MED ORDER — 0.9 % SODIUM CHLORIDE (POUR BTL) OPTIME
TOPICAL | Status: DC | PRN
Start: 2023-01-13 — End: 2023-01-13
  Administered 2023-01-13: 200 mL

## 2023-01-13 MED ORDER — FENTANYL CITRATE (PF) 100 MCG/2ML IJ SOLN
25.0000 ug | INTRAMUSCULAR | Status: DC | PRN
  Administered 2023-01-13: 25 ug via INTRAVENOUS

## 2023-01-13 MED ORDER — ONDANSETRON HCL 4 MG/2ML IJ SOLN
INTRAMUSCULAR | Status: AC
  Filled 2023-01-13: qty 2

## 2023-01-13 MED ORDER — ACETAMINOPHEN 500 MG PO TABS
1000.0000 mg | ORAL_TABLET | Freq: Once | ORAL | Status: AC
  Administered 2023-01-13: 1000 mg via ORAL

## 2023-01-13 MED ORDER — PROPOFOL 10 MG/ML IV BOLUS
INTRAVENOUS | Status: AC
  Filled 2023-01-13: qty 20

## 2023-01-13 MED ORDER — LACTATED RINGERS IV SOLN: INTRAVENOUS | Status: DC

## 2023-01-13 MED ORDER — FENTANYL CITRATE (PF) 100 MCG/2ML IJ SOLN
INTRAMUSCULAR | Status: AC
  Filled 2023-01-13: qty 2

## 2023-01-13 MED ORDER — CEFAZOLIN SODIUM-DEXTROSE 2-4 GM/100ML-% IV SOLN
INTRAVENOUS | Status: AC
  Filled 2023-01-13: qty 100

## 2023-01-13 MED ORDER — CEFAZOLIN SODIUM-DEXTROSE 2-4 GM/100ML-% IV SOLN
2.0000 g | INTRAVENOUS | Status: AC
  Administered 2023-01-13: 2 g via INTRAVENOUS

## 2023-01-13 MED ORDER — OXYCODONE HCL 5 MG PO TABS
5.0000 mg | ORAL_TABLET | Freq: Four times a day (QID) | ORAL | 0 refills | Status: AC | PRN
Start: 2023-01-13 — End: 2023-01-18

## 2023-01-13 MED ORDER — MIDAZOLAM HCL 2 MG/2ML IJ SOLN
INTRAMUSCULAR | Status: AC
  Filled 2023-01-13: qty 2

## 2023-01-13 MED ORDER — FENTANYL CITRATE (PF) 100 MCG/2ML IJ SOLN
INTRAMUSCULAR | Status: DC | PRN
  Administered 2023-01-13 (×2): 25 ug via INTRAVENOUS
  Administered 2023-01-13: 50 ug via INTRAVENOUS

## 2023-01-13 MED ORDER — ACETAMINOPHEN 500 MG PO TABS
ORAL_TABLET | ORAL | Status: AC
  Filled 2023-01-13: qty 2

## 2023-01-13 MED ORDER — PROPOFOL 500 MG/50ML IV EMUL
INTRAVENOUS | Status: DC | PRN
  Administered 2023-01-13: 125 ug/kg/min via INTRAVENOUS

## 2023-01-13 MED ORDER — BUPIVACAINE HCL (PF) 0.25 % IJ SOLN
INTRAMUSCULAR | Status: DC | PRN
  Administered 2023-01-13: 20 mL

## 2023-01-13 MED ORDER — MIDAZOLAM HCL 5 MG/5ML IJ SOLN
INTRAMUSCULAR | Status: DC | PRN
  Administered 2023-01-13: 2 mg via INTRAVENOUS

## 2023-01-13 SURGICAL SUPPLY — 43 items
APL PRP STRL LF DISP 70% ISPRP (MISCELLANEOUS) ×1
BLADE SURG 15 STRL LF DISP TIS (BLADE) ×2 IMPLANT
BLADE SURG 15 STRL SS (BLADE) ×1
BNDG CMPR 5X3 KNIT ELC UNQ LF (GAUZE/BANDAGES/DRESSINGS) ×1
BNDG CMPR 9X4 STRL LF SNTH (GAUZE/BANDAGES/DRESSINGS) ×1
BNDG COHESIVE 1X5 TAN STRL LF (GAUZE/BANDAGES/DRESSINGS) IMPLANT
BNDG ELASTIC 3INX 5YD STR LF (GAUZE/BANDAGES/DRESSINGS) IMPLANT
BNDG ESMARK 4X9 LF (GAUZE/BANDAGES/DRESSINGS) IMPLANT
BNDG GAUZE DERMACEA FLUFF 4 (GAUZE/BANDAGES/DRESSINGS) IMPLANT
BNDG GZE DERMACEA 4 6PLY (GAUZE/BANDAGES/DRESSINGS) ×1
CHLORAPREP W/TINT 26 (MISCELLANEOUS) ×2 IMPLANT
CORD BIPOLAR FORCEPS 12FT (ELECTRODE) ×2 IMPLANT
COVER BACK TABLE 60X90IN (DRAPES) ×2 IMPLANT
CUFF TOURN SGL QUICK 18X4 (TOURNIQUET CUFF) ×2 IMPLANT
CUFF TOURN SGL QUICK 24 (TOURNIQUET CUFF)
CUFF TRNQT CYL 24X4X16.5-23 (TOURNIQUET CUFF) IMPLANT
DRAPE EXTREMITY T 121X128X90 (DISPOSABLE) ×2 IMPLANT
DRAPE SURG 17X23 STRL (DRAPES) ×2 IMPLANT
GAUZE SPONGE 4X4 12PLY STRL (GAUZE/BANDAGES/DRESSINGS) IMPLANT
GAUZE XEROFORM 1X8 LF (GAUZE/BANDAGES/DRESSINGS) ×2 IMPLANT
GLOVE BIO SURGEON STRL SZ7 (GLOVE) ×2 IMPLANT
GLOVE BIOGEL PI IND STRL 7.0 (GLOVE) ×2 IMPLANT
GLOVE BIOGEL PI IND STRL 7.5 (GLOVE) IMPLANT
GOWN STRL REUS W/ TWL LRG LVL3 (GOWN DISPOSABLE) ×4 IMPLANT
GOWN STRL REUS W/ TWL XL LVL3 (GOWN DISPOSABLE) IMPLANT
GOWN STRL REUS W/TWL LRG LVL3 (GOWN DISPOSABLE) ×2
GOWN STRL REUS W/TWL XL LVL3 (GOWN DISPOSABLE) ×1
NDL HYPO 25X1 1.5 SAFETY (NEEDLE) IMPLANT
NEEDLE HYPO 25X1 1.5 SAFETY (NEEDLE) ×2
NS IRRIG 1000ML POUR BTL (IV SOLUTION) ×2 IMPLANT
PACK BASIN DAY SURGERY FS (CUSTOM PROCEDURE TRAY) ×2 IMPLANT
PAD CAST 3X4 CTTN HI CHSV (CAST SUPPLIES) IMPLANT
PADDING CAST COTTON 3X4 STRL (CAST SUPPLIES)
SHEET MEDIUM DRAPE 40X70 STRL (DRAPES) ×2 IMPLANT
SLEEVE SCD COMPRESS KNEE MED (STOCKING) IMPLANT
SPLINT FIBERGLASS 4X30 (CAST SUPPLIES) IMPLANT
SUT ETHILON 4 0 PS 2 18 (SUTURE) ×2 IMPLANT
SUT MNCRL AB 3-0 PS2 18 (SUTURE) IMPLANT
SUT VIC AB 4-0 PS2 18 (SUTURE) IMPLANT
SYR BULB EAR ULCER 3OZ GRN STR (SYRINGE) ×2 IMPLANT
SYR CONTROL 10ML LL (SYRINGE) ×2 IMPLANT
TOWEL GREEN STERILE FF (TOWEL DISPOSABLE) ×4 IMPLANT
UNDERPAD 30X36 HEAVY ABSORB (UNDERPADS AND DIAPERS) ×2 IMPLANT

## 2023-01-13 NOTE — Anesthesia Preprocedure Evaluation (Signed)
Anesthesia Evaluation  Patient identified by MRN, date of birth, ID band Patient awake    Reviewed: Allergy & Precautions, NPO status , Patient's Chart, lab work & pertinent test results  Airway Mallampati: I  TM Distance: <3 FB Neck ROM: Full    Dental  (+) Teeth Intact, Dental Advisory Given   Pulmonary former smoker   breath sounds clear to auscultation       Cardiovascular negative cardio ROS  Rhythm:Regular Rate:Normal     Neuro/Psych  PSYCHIATRIC DISORDERS  Depression       GI/Hepatic negative GI ROS, Neg liver ROS,,,  Endo/Other  negative endocrine ROS    Renal/GU negative Renal ROS  negative genitourinary   Musculoskeletal negative musculoskeletal ROS (+)    Abdominal   Peds  Hematology negative hematology ROS (+)   Anesthesia Other Findings   Reproductive/Obstetrics                             Anesthesia Physical Anesthesia Plan  ASA: 2  Anesthesia Plan: MAC   Post-op Pain Management: Tylenol PO (pre-op)* and Toradol IV (intra-op)*   Induction: Intravenous  PONV Risk Score and Plan: 3 and Ondansetron, Propofol infusion and Midazolam  Airway Management Planned: Natural Airway and Nasal Cannula  Additional Equipment:   Intra-op Plan:   Post-operative Plan:   Informed Consent: I have reviewed the patients History and Physical, chart, labs and discussed the procedure including the risks, benefits and alternatives for the proposed anesthesia with the patient or authorized representative who has indicated his/her understanding and acceptance.       Plan Discussed with: CRNA  Anesthesia Plan Comments:        Anesthesia Quick Evaluation

## 2023-01-13 NOTE — H&P (Signed)
HAND SURGERY   HPI: Patient is a 52 y.o. female who presents with a right dorsal carpal ganglion cyst.  Patient denies any changes to their medical history or new systemic symptoms today.    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    Past Surgical History:  Procedure Laterality Date   ANAL FISTULOTOMY  2013   APPENDECTOMY     CESAREAN SECTION     x 2   COLONOSCOPY N/A 10/05/2018   Procedure: COLONOSCOPY;  Surgeon: Corbin Ade, MD;  Location: [facility];  Service: Endoscopy;  Laterality: N/A;  10:30   DILITATION & CURRETTAGE/HYSTROSCOPY WITH THERMACHOICE ABLATION N/A 12/14/2013   Procedure: DILATATION & CURETTAGE/HYSTEROSCOPY WITH THERMACHOICE ABLATION; TOTAL ABLATION THERAPY TIME 8 MINUTES 47 SECONDS; 86-88 DEG C;  Surgeon: Lazaro Arms, MD;  Location: [facility];  Service: Gynecology;  Laterality: N/A;   POLYPECTOMY  10/05/2018   Procedure: POLYPECTOMY;  Surgeon: Corbin Ade, MD;  Location: [facility];  Service: Endoscopy;;  colon   Social History   Socioeconomic History   Marital status: Divorced    Spouse name: Not on file   Number of children: Not on file   Years of education: Not on file   Highest education level: Not on file  Occupational History   Not on file  Tobacco Use   Smoking status: Former    Current packs/day: 0.00    Average packs/day: 0.5 packs/day for 10.0 years (5.0 ttl pk-yrs)    Types: Cigarettes    Start date: 12/09/1986    Quit date: 12/08/1996    Years since quitting: 26.1   Smokeless tobacco: Never  Vaping Use   Vaping status: Never Used  Substance and Sexual Activity   Alcohol use: No   Drug use: No   Sexual activity: Not Currently    Birth control/protection: None  Other Topics Concern   Not on file  Social History Narrative   Not on file   Social Determinants of Health   Financial Resource Strain: Not on file  Food Insecurity: Not on file   Transportation Needs: Not on file  Physical Activity: Not on file  Stress: Not on file  Social Connections: Not on file   Family History  Problem Relation Age of Onset   Cancer Father    Stroke Mother    - negative except otherwise stated in the family history section Allergies  Allergen Reactions   Demerol [Meperidine] Other (See Comments)    Made pt feel dizzy and flushed   Prednisone     Pt got hot and flushed *Pt ok with taking benadryl    Sulfa Antibiotics Itching   Prior to Admission medications   Medication Sig Start Date End Date Taking? Authorizing Provider  buPROPion (WELLBUTRIN XL) 150 MG 24 hr tablet Take 150 mg by mouth daily.   Yes [provider]  escitalopram (LEXAPRO) 10 MG tablet Take 10 mg by mouth daily.   Yes [provider]  estradiol (ESTRACE) 1 MG tablet Take 1 mg by mouth daily.   Yes [provider]  MAGNESIUM-POTASSIUM PO Take 1 tablet by mouth daily as needed (constipation). MAG 07   Yes [provider]  Propylene Glycol (SYSTANE BALANCE) 0.6 % SOLN Place 1 drop into both eyes 2 (two) times daily as needed (dry eyes).   Yes [provider]  rosuvastatin (CRESTOR) 10 MG tablet  Take 10 mg by mouth daily.   Yes [provider]  Cholecalciferol (VITAMIN D3) 125 MCG (5000 UT) CAPS Take 5,000 Units by mouth daily.     [provider]  citalopram (CELEXA) 10 MG tablet Take 10 mg by mouth daily.     [provider]  cyclobenzaprine (FLEXERIL) 10 MG tablet Take 10 mg by mouth at bedtime as needed for muscle spasms.     [provider]  fluticasone (FLONASE) 50 MCG/ACT nasal spray Place 1 spray into both nostrils at bedtime.     [provider]  pravastatin (PRAVACHOL) 20 MG tablet Take 20 mg by mouth at bedtime.  10/09/14   [provider]   No results found. - Positive ROS: All other systems have been reviewed and were otherwise negative with the exception of  those mentioned in the HPI and as above.  Physical Exam: General: No acute distress, resting comfortably Cardiovascular: BUE warm and well perfused, normal rate Respiratory: Normal WOB on RA Skin: Warm and dry Neurologic: Sensation intact distally Psychiatric: Patient is at baseline mood and affect  Right Upper Extremity  Small, firm, round mass at dorsal radial aspect of wrist with associated tenderness to palpation.  Full AROM of wrist w/ pain on terminal extension.  Full AROM of all fingers.  SILT m/u/r distribution.  Hand warm and well perfused w/ BCR.     Assessment: 52 yo F w/ right dorsal carpal ganglion cyst  Plan: OR today for right ganglion cyst excision. We again reviewed the risks of surgery which include bleeding, infection, damage to neurovascular structures, persistent symptoms, persistent pain, mass recurrence, need for additional surgery.  Informed consent was signed.  All questions were answered.   Marlyne Beards, M.D. [facility] 1:57 PM

## 2023-01-13 NOTE — Op Note (Signed)
   Date of Surgery: 01/13/2023  INDICATIONS: Patient is a 52 y.o.-year-old female with right wrist pain and a small palpable mass at the dorsal radial wrist.  Patient notes that the mass frequently fluctuates in size and was previously much larger.  Risks, benefits, and alternatives to surgery were again discussed with the patient in the preoperative area. The patient wishes to proceed with surgery.  Informed consent was signed after our discussion.   PREOPERATIVE DIAGNOSIS:  Right dorsal carpal ganglion cyst  POSTOPERATIVE DIAGNOSIS: Same.  PROCEDURE:  Right dorsal carpal ganglion cyst excision   SURGEON: Waylan Rocher, M.D.  ASSIST: None  ANESTHESIA:  Local, MAC  IV FLUIDS AND URINE: See anesthesia.  ESTIMATED BLOOD LOSS: <5 mL.  IMPLANTS: * No implants in log *   DRAINS: None  COMPLICATIONS: None  DESCRIPTION OF PROCEDURE: The patient was met in the preoperative holding area where the surgical site was marked and the informed consent form was signed.  The patient was then brought back to the operating room and remained on the stretcher.  A hand table was placed adjacent to the operative extremity and locked into place.  A tourniquet was placed on the right forearm.  A formal timeout was performed to confirm that this was the correct patient, surgical side, surgical site, and surgical procedure.  All were present and in agreement. Following formal timeout, a local block was performed using 20 mL of 0.25% plain marcaine.  The right upper extremity was then prepped and draped in the usual and sterile fashion.    Following the second formal timeout, the limb was gently exsanguinated with an Esmarch bandage and the tourniquet inflated to 250 mmHg.  I began by making a transverse incision at the dorsal radial aspect of the wrist over the area of maximal pain and the subtle palpable lesion.  The skin was incised.  A large vein was dissected was retracted ulnarly.  The mass was palpable  in the interval between the third and fourth dorsal compartment.  A small incision was made over the extensor retinaculum.  There is a very small ganglion cyst present in this interval.  The cyst was dissected using a tenotomy scissor to the level of the wrist capsule.  The cyst was excised in its entirely along with a small portion of the capsule.  This capsular portion was at the level of the scapholunate ligament.  The underlying scapholunate ligament was identified and was found to be intact.  This small mass was then passed off the back table for permanent pathology.  The wound was then thoroughly irrigated with copious sterile saline.  The tourniquet was deflated and hemostasis was achieved with direct pressure over the wound with bipolar cautery.  The skin was then closed using a 4-0 nylon suture in a horizontal mattress fashion.  The wound was dressed with Xeroform, folded Kerlix, cast padding, and a well-padded volar splint was applied.  The patient was reversed from sedation.  All counts were correct x 2 at the end of the procedure.  The patient was then taken to the PACU in stable condition.   POSTOPERATIVE PLAN: She will be discharged home with appropriate pain medication and discharge instructions.  I will see her back in the office in 10 to 14 days for her first postop visit.  Waylan Rocher, MD 5:03 PM

## 2023-01-13 NOTE — Discharge Instructions (Addendum)
Post Anesthesia Home Care Instructions  Activity: Get plenty of rest for the remainder of the day. A responsible individual must stay with you for 24 hours following the procedure.  Next tylenol dose 6pm. For the next 24 hours, DO NOT: -Drive a car -Advertising copywriter -Drink alcoholic beverages -Take any medication unless instructed by your physician -Make any legal decisions or sign important papers.  Meals: Start with liquid foods such as gelatin or soup. Progress to regular foods as tolerated. Avoid greasy, spicy, heavy foods. If nausea and/or vomiting occur, drink only clear liquids until the nausea and/or vomiting subsides. Call your physician if vomiting continues.  Special Instructions/Symptoms: Your throat may feel dry or sore from the anesthesia or the breathing tube placed in your throat during surgery. If this causes discomfort, gargle with warm salt water. The discomfort should disappear within 24 hours.  If you had a scopolamine patch placed behind your ear for the management of post- operative nausea and/or vomiting:  1. The medication in the patch is effective for 72 hours, after which it should be removed.  Wrap patch in a tissue and discard in the trash. Wash hands thoroughly with soap and water. 2. You may remove the patch earlier than 72 hours if you experience unpleasant side effects which may include dry mouth, dizziness or visual disturbances. 3. Avoid touching the patch. Wash your hands with soap and water after contact with the patch.    Post Anesthesia Home Care Instructions  Activity: Get plenty of rest for the remainder of the day. A responsible individual must stay with you for 24 hours following the procedure.  For the next 24 hours, DO NOT: -Drive a car -Advertising copywriter -Drink alcoholic beverages -Take any medication unless instructed by your physician -Make any legal decisions or sign important papers.  Meals: Start with liquid foods such as  gelatin or soup. Progress to regular foods as tolerated. Avoid greasy, spicy, heavy foods. If nausea and/or vomiting occur, drink only clear liquids until the nausea and/or vomiting subsides. Call your physician if vomiting continues.  Special Instructions/Symptoms: Your throat may feel dry or sore from the anesthesia or the breathing tube placed in your throat during surgery. If this causes discomfort, gargle with warm salt water. The discomfort should disappear within 24 hours.  If you had a scopolamine patch placed behind your ear for the management of post- operative nausea and/or vomiting:  1. The medication in the patch is effective for 72 hours, after which it should be removed.  Wrap patch in a tissue and discard in the trash. Wash hands thoroughly with soap and water. 2. You may remove the patch earlier than 72 hours if you experience unpleasant side effects which may include dry mouth, dizziness or visual disturbances. 3. Avoid touching the patch. Wash your hands with soap and water after contact with the patch.     Waylan Rocher, M.D. Hand Surgery  POST-OPERATIVE DISCHARGE INSTRUCTIONS   PRESCRIPTIONS: You may have been given a prescription to be taken as directed for post-operative pain control.  You may also take over the counter ibuprofen/aleve and tylenol for pain. Take this as directed on the packaging. Do not exceed 3000 mg tylenol/acetaminophen in 24 hours.  Ibuprofen 600-800 mg (3-4) tablets by mouth every 6 hours as needed for pain.  OR Aleve 2 tablets by mouth every 12 hours (twice daily) as needed for pain.  AND/OR Tylenol 1000 mg (2 tablets) every 8 hours as needed for pain.  Please use your pain medication carefully, as refills are limited and you may not be provided with one.  As stated above, please use over the counter pain medicine - it will also be helpful with decreasing your swelling.    ANESTHESIA: After your surgery, post-surgical discomfort or pain  is likely. This discomfort can last several days to a few weeks. At certain times of the day your discomfort may be more intense.   Did you receive a nerve block?  A nerve block can provide pain relief for one hour to two days after your surgery. As long as the nerve block is working, you will experience little or no sensation in the area the surgeon operated on.  As the nerve block wears off, you will begin to experience pain or discomfort. It is very important that you begin taking your prescribed pain medication before the nerve block fully wears off. Treating your pain at the first sign of the block wearing off will ensure your pain is better controlled and more tolerable when full-sensation returns. Do not wait until the pain is intolerable, as the medicine will be less effective. It is better to treat pain in advance than to try and catch up.   General Anesthesia:  If you did not receive a nerve block during your surgery, you will need to start taking your pain medication shortly after your surgery and should continue to do so as prescribed by your surgeon.     ICE AND ELEVATION: You may use ice for the first 48-72 hours, but it is not critical.   Motion of your fingers is very important to decrease the swelling.  Elevation, as much as possible for the next 48 hours, is critical for decreasing swelling as well as for pain relief. Elevation means when you are seated or lying down, you hand should be at or above your heart. When walking, the hand needs to be at or above the level of your elbow.  If the bandage gets too tight, it may need to be loosened. Please contact our office and we will instruct you in how to do this.    SURGICAL BANDAGES:  Keep your dressing and/or splint clean and dry at all times.  Do not remove until you are seen again in the office.  If careful, you may place a plastic bag over your bandage and tape the end to shower, but be careful, do not get your bandages wet.      HAND THERAPY:  You may not need any. If you do, we will begin this at your follow up visit in the clinic.    ACTIVITY AND WORK: You are encouraged to move any fingers which are not in the bandage.  Light use of the fingers is allowed to assist the other hand with daily hygiene and eating, but strong gripping or lifting is often uncomfortable and should be avoided.  You might miss a variable period of time from work and hopefully this issue has been discussed prior to surgery. You may not do any heavy work with your affected hand for about 2 weeks.    [facility] Second Floor, [address] [place], [place] [address] [phone]

## 2023-01-13 NOTE — Interval H&P Note (Signed)
History and Physical Interval Note:  01/13/2023 1:58 PM  Desiree Chen  has presented today for surgery, with the diagnosis of Right dorsal carpal ganglion cyst.  The various methods of treatment have been discussed with the patient and family. After consideration of risks, benefits and other options for treatment, the patient has consented to  Procedure(s) with comments: Right dorsal carpal ganglion cyst excision (Right) - local 30 as a surgical intervention.  The patient's history has been reviewed, patient examined, no change in status, stable for surgery.  I have reviewed the patient's chart and labs.  Questions were answered to the patient's satisfaction.     [name] [name]

## 2023-01-13 NOTE — Anesthesia Postprocedure Evaluation (Signed)
Anesthesia Post Note  Patient: Desiree Chen  Procedure(s) Performed: Right dorsal carpal ganglion cyst excision (Right: Wrist)     Patient location during evaluation: PACU Anesthesia Type: MAC Level of consciousness: awake and alert Pain management: pain level controlled Vital Signs Assessment: post-procedure vital signs reviewed and stable Respiratory status: spontaneous breathing, nonlabored ventilation, respiratory function stable and patient connected to nasal cannula oxygen Cardiovascular status: stable and blood pressure returned to baseline Postop Assessment: no apparent nausea or vomiting Anesthetic complications: no  No notable events documented.  Last Vitals:  Vitals:   01/13/23 1715 01/13/23 1726  BP: (!) 157/72 119/70  Pulse:  61  Resp:  18  Temp:  (!) 36.2 C  SpO2:  98%    Last Pain:  Vitals:   01/13/23 1726  TempSrc: Oral  PainSc: 7                  Shelton Silvas

## 2023-01-13 NOTE — Transfer of Care (Signed)
Immediate Anesthesia Transfer of Care Note  Patient: Desiree Chen  Procedure(s) Performed: Right dorsal carpal ganglion cyst excision (Right: Wrist)  Patient Location: PACU  Anesthesia Type:MAC  Level of Consciousness: awake, alert , and oriented  Airway & Oxygen Therapy: Patient Spontanous Breathing  Post-op Assessment: Report given to RN and Post -op Vital signs reviewed and stable  Post vital signs: Reviewed and stable  Last Vitals:  Vitals Value Taken Time  BP    Temp    Pulse    Resp    SpO2      Last Pain:  Vitals:   01/13/23 1308  TempSrc: Temporal  PainSc: 4       Patients Stated Pain Goal: 4 (01/13/23 1308)  Complications: No notable events documented.

## 2023-01-14 ENCOUNTER — Encounter (HOSPITAL_BASED_OUTPATIENT_CLINIC_OR_DEPARTMENT_OTHER): Payer: Self-pay | Admitting: Orthopedic Surgery

## 2023-01-18 LAB — SURGICAL PATHOLOGY

## 2023-05-04 ENCOUNTER — Other Ambulatory Visit (HOSPITAL_COMMUNITY): Payer: Self-pay | Admitting: Family Medicine

## 2023-05-04 DIAGNOSIS — Z1231 Encounter for screening mammogram for malignant neoplasm of breast: Secondary | ICD-10-CM

## 2023-06-21 ENCOUNTER — Ambulatory Visit (HOSPITAL_COMMUNITY)
Admission: RE | Admit: 2023-06-21 | Discharge: 2023-06-21 | Disposition: A | Discharge status: Home / Self Care | Payer: Self-pay | Source: Ambulatory Visit | Attending: Family Medicine | Admitting: Family Medicine

## 2023-06-21 ENCOUNTER — Encounter (HOSPITAL_COMMUNITY): Payer: Self-pay

## 2023-06-21 DIAGNOSIS — Z1231 Encounter for screening mammogram for malignant neoplasm of breast: Secondary | ICD-10-CM | POA: Insufficient documentation

## 2023-09-16 ENCOUNTER — Encounter (INDEPENDENT_AMBULATORY_CARE_PROVIDER_SITE_OTHER): Payer: Self-pay | Admitting: *Deleted

## 2023-10-01 ENCOUNTER — Telehealth: Payer: Self-pay

## 2023-10-01 NOTE — Telephone Encounter (Signed)
 Who is your primary care physician: Dr.Golding  Reasons for the colonoscopy: Hx polyps  Have you had a colonoscopy before?  Yes 10-05-2018 Dr.Rourk  Do you have family history of colon cancer? no  Previous colonoscopy with polyps removed? yes  Do you have a history colorectal cancer?   no  Are you diabetic? If yes, Type 1 or Type 2?    no  Do you have a prosthetic or mechanical heart valve? no  Do you have a pacemaker/defibrillator?   no  Have you had endocarditis/atrial fibrillation? no  Have you had joint replacement within the last 12 months?  no  Do you tend to be constipated or have to use laxatives? no  Do you have any history of drugs or alchohol?  Yes occasional alcohol  Do you use supplemental oxygen?  no  Have you had a stroke or heart attack within the last 6 months? no  Do you take weight loss medication?  yes   Do you take any blood-thinning medications such as: (aspirin, warfarin, Plavix, Aggrenox)  no  If yes we need the name, milligram, dosage and who is prescribing doctor  Current Outpatient Medications on File Prior to Visit  Medication Sig Dispense Refill   buPROPion (WELLBUTRIN XL) 150 MG 24 hr tablet Take 150 mg by mouth daily.     Cholecalciferol (VITAMIN D3) 125 MCG (5000 UT) CAPS Take 5,000 Units by mouth daily.      citalopram (CELEXA) 10 MG tablet Take 10 mg by mouth daily.      cyclobenzaprine (FLEXERIL) 10 MG tablet Take 10 mg by mouth at bedtime as needed for muscle spasms.      escitalopram (LEXAPRO) 10 MG tablet Take 10 mg by mouth daily.     estradiol  (ESTRACE ) 1 MG tablet Take 1 mg by mouth daily.     fluticasone (FLONASE) 50 MCG/ACT nasal spray Place 1 spray into both nostrils at bedtime.      MAGNESIUM-POTASSIUM PO Take 1 tablet by mouth daily as needed (constipation). MAG 07     pravastatin (PRAVACHOL) 20 MG tablet Take 20 mg by mouth at bedtime.      Propylene Glycol (SYSTANE BALANCE) 0.6 % SOLN Place 1 drop into both eyes 2 (two)  times daily as needed (dry eyes).     rosuvastatin (CRESTOR) 10 MG tablet Take 10 mg by mouth daily.     No current facility-administered medications on file prior to visit.    Allergies  Allergen Reactions   Demerol  [Meperidine ] Other (See Comments)    Made pt feel dizzy and flushed   Prednisone     Pt got hot and flushed *Pt ok with taking benadryl    Sulfa Antibiotics Itching     Pharm[facility]: [facility] [place]  Primary Insurance Name: [employer]  Best number where you can be reached: [phone]

## 2023-11-16 NOTE — Telephone Encounter (Signed)
 2020: one 5 mm polyps in sigmoid. Hyperplastic. FH of advanced adenoma.   Hold tirzepatide one week prior.

## 2023-11-17 NOTE — Telephone Encounter (Signed)
 What ASA is she?

## 2023-11-19 NOTE — Telephone Encounter (Signed)
ASA 2.  ?

## 2023-11-26 ENCOUNTER — Encounter: Payer: Self-pay | Admitting: *Deleted

## 2023-11-26 ENCOUNTER — Other Ambulatory Visit: Payer: Self-pay | Admitting: *Deleted

## 2023-11-26 MED ORDER — NA SULFATE-K SULFATE-MG SULF 17.5-3.13-1.6 GM/177ML PO SOLN: ORAL | 0 refills | Status: AC

## 2023-11-26 NOTE — Telephone Encounter (Signed)
 Questionnaire from recall, no referral needed

## 2023-11-26 NOTE — Telephone Encounter (Signed)
 Pt has been scheduled for 01/03/24. Instructions mailed and prep sent to pharmacy, Pt states that she is no longer on Tirepatide.

## 2023-12-27 ENCOUNTER — Ambulatory Visit: Admitting: Physician Assistant

## 2023-12-27 ENCOUNTER — Encounter: Payer: Self-pay | Admitting: Physician Assistant

## 2023-12-27 VITALS — BP 116/74 | Wt 155.2 lb

## 2023-12-27 DIAGNOSIS — Z0001 Encounter for general adult medical examination with abnormal findings: Secondary | ICD-10-CM | POA: Diagnosis not present

## 2023-12-27 DIAGNOSIS — E782 Mixed hyperlipidemia: Secondary | ICD-10-CM

## 2023-12-27 DIAGNOSIS — Z7689 Persons encountering health services in other specified circumstances: Secondary | ICD-10-CM

## 2023-12-27 DIAGNOSIS — Z0000 Encounter for general adult medical examination without abnormal findings: Secondary | ICD-10-CM

## 2023-12-27 NOTE — Progress Notes (Signed)
 Complete physical exam  Patient: Desiree Chen   DOB: [birth year]   53 y.o. Female  MRN: [id number]  Subjective:    Chief Complaint  Patient presents with   Establish Care    Desiree Chen is a 53 y.o. female who presents today for a complete physical exam. She reports consuming a general diet. The patient does not participate in regular exercise at present. She generally feels well. She reports sleeping well. She does not have additional problems to discuss today.    Most recent fall risk assessment:    12/27/2023    4:37 PM  Fall Risk   Falls in the past year? 0     Most recent depression screenings:    12/27/2023    4:10 PM  PHQ 2/9 Scores  PHQ - 2 Score 0    Vision:Not within last year  and Dental: No current dental problems and Receives regular dental care  Patient Care Team: Latangela Mccomas, Charmaine, [place] as PCP - General (Physician Assistant) Shaaron, Lamar HERO, MD as Consulting Physician (Gastroenterology) Sheldon Standing, MD as Consulting Physician (Colon and Rectal Surgery) Jayne Vonn DEL, MD as Consulting Physician (Obstetrics and Gynecology)   Outpatient Medications Prior to Visit  Medication Sig   Na Sulfate-K Sulfate-Mg Sulfate concentrate (SUPREP) 17.5-3.13-1.6 GM/177ML SOLN As directed   buPROPion (WELLBUTRIN XL) 150 MG 24 hr tablet Take 150 mg by mouth daily.   Cobalamin Combinations (B-12) [phone] MCG SUBL Place 1 tablet under the tongue daily at 12 noon. (Patient not taking: Reported on 12/27/2023)   escitalopram (LEXAPRO) 10 MG tablet Take 10 mg by mouth daily.   estradiol  (ESTRACE ) 1 MG tablet Take 1 mg by mouth daily.   levOCARNitine (CARNITINE PO) Take 1 packet by mouth daily at 12 noon. (Patient not taking: Reported on 12/27/2023)   rosuvastatin (CRESTOR) 10 MG tablet Take 10 mg by mouth daily.   TIRZEPATIDE [place] Inject into the skin.   No facility-administered medications prior to visit.    Review of Systems  Constitutional:  Negative for chills, fever and  malaise/fatigue.  Eyes:  Negative for blurred vision and double vision.  Respiratory:  Negative for cough and shortness of breath.   Cardiovascular:  Negative for chest pain and palpitations.  Musculoskeletal:  Negative for joint pain and myalgias.  Neurological:  Negative for dizziness and headaches.       Objective:     BP 116/74   Wt 155 lb 3.2 oz (70.4 kg)   LMP 04/13/2017 (Approximate)   BMI 28.39 kg/m   Physical Exam Constitutional:      General: She is not in acute distress.    Appearance: Normal appearance. She is not ill-appearing.  HENT:     Head: Normocephalic and atraumatic.     Mouth/Throat:     Mouth: Mucous membranes are moist.     Pharynx: Oropharynx is clear.  Eyes:     Extraocular Movements: Extraocular movements intact.     Conjunctiva/sclera: Conjunctivae normal.  Cardiovascular:     Rate and Rhythm: Normal rate and regular rhythm.     Heart sounds: Normal heart sounds. No murmur heard. Pulmonary:     Effort: Pulmonary effort is normal.     Breath sounds: Normal breath sounds. No wheezing or rales.  Skin:    General: Skin is warm and dry.  Neurological:     General: No focal deficit present.     Mental Status: She is alert and oriented to person, place, and time.  Psychiatric:        Mood and Affect: Mood normal.        Behavior: Behavior normal.      No results found for any visits on 12/27/23.    Assessment & Plan:    Routine Health Maintenance and Physical Exam  Health Maintenance  Topic Date Due   HIV Screening  Never done   Hepatitis C Screening  Never done   Hepatitis B Vaccine (1 of 3 - 19+ 3-dose series) Never done   Pap with HPV screening  06/21/2018   Pneumococcal Vaccine for age over 32 (1 of 1 - PCV) Never done   COVID-19 Vaccine (3 - Moderna risk series) 01/12/2024*   Flu Shot  07/11/2024*   Breast Cancer Screening  06/20/2025   DTaP/Tdap/Td vaccine (2 - Tdap) 12/26/2026   Colon Cancer Screening  10/04/2028   Zoster  (Shingles) Vaccine  Completed   HPV Vaccine  Aged Out   Meningitis B Vaccine  Aged Out  *Topic was postponed. The date shown is not the original due date.    Discussed health benefits of physical activity, and encouraged her to engage in regular exercise appropriate for her age and condition.  Problem List Items Addressed This Visit     Hyperlipidemia   Relevant Orders   Lipid panel   Other Visit Diagnoses       Encounter to establish care    -  Primary     Annual visit for general adult medical examination without abnormal findings       Relevant Orders   CMP14+EGFR   CBC with Differential/Platelet      Return in about 1 year (around 12/26/2024), or sooner as needed.  Safety measures discussed. Immunizations reviewed: defers influenza vaccine today Diet and exercise/ lifestyle modifications discussed. Recommend 150 minutes per week of exercise such as walking. Recommend lots of fresh produce to include fruits, vegetables, beans, healthy fats such as avocado, nuts, seeds, and 3-6 ounces of protein at each meal.  Avoid fried foods and fast food. Limit alcohol consumption: no more than one drink per day for women and 2 drinks per day for men.  Stress management discussed. Routine vision and dental screening discussed: recommend dentist every 6 months, gets vision checked every 1-2 years.  Health maintenance: up to date on mammogram, reports up to date on Pap- unable to verify on chart review, upcoming colonoscopy.  Questions answered.        Charmaine Dannell Raczkowski, PA-C

## 2023-12-28 ENCOUNTER — Ambulatory Visit: Payer: Self-pay | Admitting: Physician Assistant

## 2023-12-28 LAB — CBC WITH DIFFERENTIAL/PLATELET
Basophils Absolute: 0 x10E3/uL (ref 0.0–0.2)
Basos: 0 %
EOS (ABSOLUTE): 0.2 x10E3/uL (ref 0.0–0.4)
Eos: 3 %
Hematocrit: 46 % (ref 34.0–46.6)
Hemoglobin: 14.9 g/dL (ref 11.1–15.9)
Immature Grans (Abs): 0 x10E3/uL (ref 0.0–0.1)
Immature Granulocytes: 0 %
Lymphocytes Absolute: 2.4 x10E3/uL (ref 0.7–3.1)
Lymphs: 31 %
MCH: 28.9 pg (ref 26.6–33.0)
MCHC: 32.4 g/dL (ref 31.5–35.7)
MCV: 89 fL (ref 79–97)
Monocytes Absolute: 0.4 x10E3/uL (ref 0.1–0.9)
Monocytes: 6 %
Neutrophils Absolute: 4.6 x10E3/uL (ref 1.4–7.0)
Neutrophils: 60 %
Platelets: 351 x10E3/uL (ref 150–450)
RBC: 5.16 x10E6/uL (ref 3.77–5.28)
RDW: 12.5 % (ref 11.7–15.4)
WBC: 7.6 x10E3/uL (ref 3.4–10.8)

## 2023-12-28 LAB — CMP14+EGFR
ALT: 18 IU/L (ref 0–32)
AST: 19 IU/L (ref 0–40)
Albumin: 4.6 g/dL (ref 3.8–4.9)
Alkaline Phosphatase: 77 IU/L (ref 49–135)
BUN/Creatinine Ratio: 26 — ABNORMAL HIGH (ref 9–23)
BUN: 21 mg/dL (ref 6–24)
Bilirubin Total: <0.2 mg/dL (ref 0.0–1.2)
CO2: 21 mmol/L (ref 20–29)
Calcium: 9.4 mg/dL (ref 8.7–10.2)
Chloride: 103 mmol/L (ref 96–106)
Creatinine, Ser: 0.81 mg/dL (ref 0.57–1.00)
Globulin, Total: 2.5 g/dL (ref 1.5–4.5)
Glucose: 77 mg/dL (ref 70–99)
Potassium: 4.5 mmol/L (ref 3.5–5.2)
Sodium: 142 mmol/L (ref 134–144)
Total Protein: 7.1 g/dL (ref 6.0–8.5)
eGFR: 87 mL/min/1.73 (ref >59)

## 2023-12-28 LAB — LIPID PANEL
Chol/HDL Ratio: 3.4 ratio (ref 0.0–4.4)
Cholesterol, Total: 203 mg/dL — ABNORMAL HIGH (ref 100–199)
HDL: 60 mg/dL (ref >39)
LDL Chol Calc (NIH): 104 mg/dL — ABNORMAL HIGH (ref 0–99)
Triglycerides: 229 mg/dL — ABNORMAL HIGH (ref 0–149)
VLDL Cholesterol Cal: 39 mg/dL (ref 5–40)

## 2023-12-28 NOTE — Telephone Encounter (Signed)
 Pt called and stated she received an estimate on her upcoming procedure. She says she was under the impression that it is preventative. Advised pt that she has a history of polyps therefore it will be considered a diagnostic colonoscopy and that she didn't have to pay the money up front, she can for it to be sent through insurance and then bill her. Pt verbalized understanding.

## 2024-01-03 ENCOUNTER — Ambulatory Visit (HOSPITAL_COMMUNITY): Admitting: Anesthesiology

## 2024-01-03 ENCOUNTER — Encounter (HOSPITAL_COMMUNITY): Payer: Self-pay | Admitting: Internal Medicine

## 2024-01-03 ENCOUNTER — Encounter (HOSPITAL_COMMUNITY)
Admission: RE | Disposition: A | Discharge status: Home / Self Care | Payer: Self-pay | Source: Home / Self Care | Attending: Internal Medicine

## 2024-01-03 ENCOUNTER — Other Ambulatory Visit: Payer: Self-pay

## 2024-01-03 ENCOUNTER — Ambulatory Visit (HOSPITAL_BASED_OUTPATIENT_CLINIC_OR_DEPARTMENT_OTHER): Admitting: Anesthesiology

## 2024-01-03 ENCOUNTER — Ambulatory Visit (HOSPITAL_COMMUNITY)
Admission: RE | Admit: 2024-01-03 | Discharge: 2024-01-03 | Disposition: A | Discharge status: Home / Self Care | Attending: Internal Medicine | Admitting: Internal Medicine

## 2024-01-03 DIAGNOSIS — K642 Third degree hemorrhoids: Secondary | ICD-10-CM

## 2024-01-03 DIAGNOSIS — E785 Hyperlipidemia, unspecified: Secondary | ICD-10-CM | POA: Insufficient documentation

## 2024-01-03 DIAGNOSIS — Z860100 Personal history of colon polyps, unspecified: Secondary | ICD-10-CM | POA: Insufficient documentation

## 2024-01-03 DIAGNOSIS — K644 Residual hemorrhoidal skin tags: Secondary | ICD-10-CM | POA: Insufficient documentation

## 2024-01-03 DIAGNOSIS — Z8371 Family history of adenomatous and serrated polyps: Secondary | ICD-10-CM

## 2024-01-03 DIAGNOSIS — Z87891 Personal history of nicotine dependence: Secondary | ICD-10-CM | POA: Diagnosis not present

## 2024-01-03 DIAGNOSIS — Z1211 Encounter for screening for malignant neoplasm of colon: Secondary | ICD-10-CM

## 2024-01-03 HISTORY — PX: COLONOSCOPY: SHX5424

## 2024-01-03 SURGERY — COLONOSCOPY
Anesthesia: General

## 2024-01-03 MED ORDER — LACTATED RINGERS IV SOLN: INTRAVENOUS | Status: DC

## 2024-01-03 MED ORDER — LIDOCAINE 2% (20 MG/ML) 5 ML SYRINGE
INTRAMUSCULAR | Status: DC | PRN
  Administered 2024-01-03: 60 mg via INTRAVENOUS

## 2024-01-03 MED ORDER — PHENYLEPHRINE 80 MCG/ML (10ML) SYRINGE FOR IV PUSH (FOR BLOOD PRESSURE SUPPORT)
PREFILLED_SYRINGE | INTRAVENOUS | Status: DC | PRN
  Administered 2024-01-03 (×2): 80 ug via INTRAVENOUS

## 2024-01-03 MED ORDER — PROPOFOL 10 MG/ML IV BOLUS
INTRAVENOUS | Status: DC | PRN
  Administered 2024-01-03: 70 mg via INTRAVENOUS
  Administered 2024-01-03: 125 ug/kg/min via INTRAVENOUS
  Administered 2024-01-03: 30 mg via INTRAVENOUS

## 2024-01-03 MED ORDER — EPHEDRINE SULFATE-NACL 50-0.9 MG/10ML-% IV SOSY
PREFILLED_SYRINGE | INTRAVENOUS | Status: DC | PRN
  Administered 2024-01-03: 5 mg via INTRAVENOUS
  Administered 2024-01-03 (×2): 10 mg via INTRAVENOUS

## 2024-01-03 NOTE — Discharge Instructions (Signed)
  Colonoscopy Discharge Instructions  Read the instructions outlined below and refer to this sheet in the next few weeks. These discharge instructions provide you with general information on caring for yourself after you leave the hospital. Your doctor may also give you specific instructions. While your treatment has been planned according to the most current medical practices available, unavoidable complications occasionally occur. If you have any problems or questions after discharge, call Dr. Shaaron at [phone]. ACTIVITY You may resume your regular activity, but move at a slower pace for the next 24 hours.  Take frequent rest periods for the next 24 hours.  Walking will help get rid of the air and reduce the bloated feeling in your belly (abdomen).  No driving for 24 hours (because of the medicine (anesthesia) used during the test).   Do not sign any important legal documents or operate any machinery for 24 hours (because of the anesthesia used during the test).  NUTRITION Drink plenty of fluids.  You may resume your normal diet as instructed by your doctor.  Begin with a light meal and progress to your normal diet. Heavy or fried foods are harder to digest and may make you feel sick to your stomach (nauseated).  Avoid alcoholic beverages for 24 hours or as instructed.  MEDICATIONS You may resume your normal medications unless your doctor tells you otherwise.  WHAT YOU CAN EXPECT TODAY Some feelings of bloating in the abdomen.  Passage of more gas than usual.  Spotting of blood in your stool or on the toilet paper.  IF YOU HAD POLYPS REMOVED DURING THE COLONOSCOPY: No aspirin products for 7 days or as instructed.  No alcohol for 7 days or as instructed.  Eat a soft diet for the next 24 hours.  FINDING OUT THE RESULTS OF YOUR TEST Not all test results are available during your visit. If your test results are not back during the visit, make an appointment with your caregiver to find out the  results. Do not assume everything is normal if you have not heard from your caregiver or the medical facility. It is important for you to follow up on all of your test results.  SEEK IMMEDIATE MEDICAL ATTENTION IF: You have more than a spotting of blood in your stool.  Your belly is swollen (abdominal distention).  You are nauseated or vomiting.  You have a temperature over 101.  You have abdominal pain or discomfort that is severe or gets worse throughout the day.     No polyps found today.  You do have hemorrhoids.  It is recommended you return for repeat colonoscopy in 5 years

## 2024-01-03 NOTE — Anesthesia Preprocedure Evaluation (Signed)
 Anesthesia Evaluation  Patient identified by MRN, date of birth, ID band Patient awake    Reviewed: Allergy & Precautions, H&P , NPO status , Patient's Chart, lab work & pertinent test results, reviewed documented beta blocker date and time   Airway Mallampati: II  TM Distance: >3 FB Neck ROM: full    Dental no notable dental hx.    Pulmonary neg pulmonary ROS, former smoker   Pulmonary exam normal breath sounds clear to auscultation       Cardiovascular Exercise Tolerance: Good hypertension, negative cardio ROS  Rhythm:regular Rate:Normal     Neuro/Psych  PSYCHIATRIC DISORDERS  Depression    negative neurological ROS     GI/Hepatic negative GI ROS, Neg liver ROS,,,  Endo/Other  negative endocrine ROS    Renal/GU negative Renal ROS  negative genitourinary   Musculoskeletal   Abdominal   Peds  Hematology negative hematology ROS (+)   Anesthesia Other Findings   Reproductive/Obstetrics negative OB ROS                             Anesthesia Physical Anesthesia Plan  ASA: 2  Anesthesia Plan: General   Post-op Pain Management:    Induction:   PONV Risk Score and Plan: Propofol infusion  Airway Management Planned:   Additional Equipment:   Intra-op Plan:   Post-operative Plan:   Informed Consent: I have reviewed the patients History and Physical, chart, labs and discussed the procedure including the risks, benefits and alternatives for the proposed anesthesia with the patient or authorized representative who has indicated his/her understanding and acceptance.     Dental Advisory Given  Plan Discussed with: CRNA  Anesthesia Plan Comments:        Anesthesia Quick Evaluation

## 2024-01-03 NOTE — Op Note (Signed)
 [facility] Patient Name: Desiree Chen Procedure Date: 01/03/2024 7:53 AM MRN: [id number] Date of Birth: October 14, 1970 Attending MD: Lamar Ozell Hollingshead , MD, [id number] CSN: [id number] Age: 53 Admit Type: Outpatient Procedure:                Colonoscopy Indications:              High risk colon cancer surveillance: Personal                            history of colonic polyps Providers:                Lamar Ozell Hollingshead, MD, Jon LABOR. Gerome RN, RN,                            Bascom Blush Referring MD:             Lamar Ozell Hollingshead, MD Medicines:                Propofol  per Anesthesia Complications:            No immediate complications. Estimated Blood Loss:     Estimated blood loss: none. Procedure:                Pre-Anesthesia Assessment:                           - Prior to the procedure, a History and Physical                            was performed, and patient medications and                            allergies were reviewed. The patient's tolerance of                            previous anesthesia was also reviewed. The risks                            and benefits of the procedure and the sedation                            options and risks were discussed with the patient.                            All questions were answered, and informed consent                            was obtained. Prior Anticoagulants: The patient has                            taken no anticoagulant or antiplatelet agents. ASA                            Grade Assessment: II - A patient with mild systemic  disease. After reviewing the risks and benefits,                            the patient was deemed in satisfactory condition to                            undergo the procedure.                           After obtaining informed consent, the colonoscope                            was passed under direct vision. Throughout the                             procedure, the patient's blood pressure, pulse, and                            oxygen saturations were monitored continuously. The                            [id number] ([id number]) Colon was introduced through                            the anus and advanced to the the cecum, identified                            by appendiceal orifice and ileocecal valve. The                            colonoscopy was performed without difficulty. The                            patient tolerated the procedure well. The quality                            of the bowel preparation was adequate. The                            ileocecal valve, appendiceal orifice, and rectum                            were photographed. Scope In: 8:13:28 AM Scope Out: 8:22:27 AM Scope Withdrawal Time: 0 hours 5 minutes 46 seconds  Total Procedure Duration: 0 hours 8 minutes 59 seconds  Findings:      Hemorrhoids were found on perianal exam.      External and internal hemorrhoids were found during digital exam. The       hemorrhoids were moderate, medium-sized and Grade III (internal       hemorrhoids that prolapse but require manual reduction).      The colon (entire examined portion) appeared normal.      The retroflexed view of the distal rectum and anal verge was normal and       showed no anal or rectal abnormalities Impression:               -  Hemorrhoids found on perianal exam.                           - External and internal hemorrhoids.                           - The entire examined colon is normal.                           - The distal rectum and anal verge are normal on                            retroflexion view.                           - No specimens collected. Moderate Sedation:      Moderate (conscious) sedation was personally administered by an       anesthesia professional. The following parameters were monitored: oxygen       saturation, heart rate, blood pressure, respiratory rate, EKG, adequacy        of pulmonary ventilation, and response to care. Recommendation:           - Patient has a contact number available for                            emergencies. The signs and symptoms of potential                            delayed complications were discussed with the                            patient. Return to normal activities tomorrow.                            Written discharge instructions were provided to the                            patient.                           - Advance diet as tolerated. Recommend repeat                            colonoscopy in 5 years given personal history of                            colonic polyps and a positive family history of                            advanced adenoma. Procedure Code(s):        --- Professional ---                           [phone], Colonoscopy, flexible; diagnostic, including  collection of specimen(s) by brushing or washing,                            when performed (separate procedure) Diagnosis Code(s):        --- Professional ---                           Z86.010, Personal history of colonic polyps                           K64.2, Third degree hemorrhoids CPT copyright 2022 American Medical Association. All rights reserved. The codes documented in this report are preliminary and upon coder review may  be revised to meet current compliance requirements. Lamar HERO. Didier Brandenburg, MD Lamar Ozell Hollingshead, MD 01/03/2024 8:31:34 AM This report has been signed electronically. Number of Addenda: 0

## 2024-01-03 NOTE — Anesthesia Procedure Notes (Signed)
 Date/Time: 01/03/2024 8:08 AM  Performed by: Para Jerelene CROME, CRNAOxygen Delivery Method: Nasal cannula

## 2024-01-03 NOTE — Transfer of Care (Addendum)
 Immediate Anesthesia Transfer of Care Note  Patient: Desiree Chen  Procedure(s) Performed: COLONOSCOPY  Patient Location: Endoscopy Unit  Anesthesia Type:General  Level of Consciousness: drowsy and patient cooperative  Airway & Oxygen Therapy: Patient Spontanous Breathing  Post-op Assessment: Report given to RN and Post -op Vital signs reviewed and stable  Post vital signs: Reviewed and stable  Last Vitals:  Vitals Value Taken Time  BP 121/60 01/03/24   0829  Temp 36.1 01/03/24   0829  Pulse 70 01/03/24   0829  Resp 14 01/03/24   0829  SpO2 97% 01/03/24   0829    Last Pain:  Vitals:   01/03/24 0811  TempSrc:   PainSc: 0-No pain         Complications: No notable events documented.

## 2024-01-03 NOTE — Anesthesia Postprocedure Evaluation (Signed)
 Anesthesia Post Note  Patient: Desiree Chen  Procedure(s) Performed: COLONOSCOPY  Patient location during evaluation: Phase II Anesthesia Type: General Level of consciousness: awake Pain management: pain level controlled Vital Signs Assessment: post-procedure vital signs reviewed and stable Respiratory status: spontaneous breathing and respiratory function stable Cardiovascular status: blood pressure returned to baseline and stable Postop Assessment: no headache and no apparent nausea or vomiting Anesthetic complications: no Comments: Late entry   No notable events documented.   Last Vitals:  Vitals:   01/03/24 0718 01/03/24 0829  BP: 121/74 121/60  Pulse: 63 70  Resp: 11 14  Temp: 36.4 C (!) 36.1 C  SpO2: 98% 97%    Last Pain:  Vitals:   01/03/24 0829  TempSrc: Oral  PainSc: 0-No pain                 Yvonna JINNY Bosworth

## 2024-01-03 NOTE — H&P (Signed)
 @LOGO @   Gastroenterology Progress Note    Primary Care Physician:  Grooms, Charmaine, [place] Primary Gastroenterologist:  Dr. Shaaron  Pre-Procedure History & Physical: HPI:  Desiree Chen is a 53 y.o. female here for surveillance colonoscopy.  Personal history colonic polyps positive family history of advanced adenoma in first-degree relative.  Last colonoscopy 2020.  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      Past Surgical History:  Procedure Laterality Date   ANAL FISTULOTOMY  2013   APPENDECTOMY     CESAREAN SECTION     x 2   COLONOSCOPY N/A 10/05/2018   Procedure: COLONOSCOPY;  Surgeon: Shaaron Lamar HERO, MD;  Location: [facility];  Service: Endoscopy;  Laterality: N/A;  10:30   CYST EXCISION Right 01/13/2023   Procedure: Right dorsal carpal ganglion cyst excision;  Surgeon: Romona Harari, MD;  Location: [facility];  Service: Orthopedics;  Laterality: Right;   DILITATION & CURRETTAGE/HYSTROSCOPY WITH THERMACHOICE ABLATION N/A 12/14/2013   Procedure: DILATATION & CURETTAGE/HYSTEROSCOPY WITH THERMACHOICE ABLATION; TOTAL ABLATION THERAPY TIME 8 MINUTES 47 SECONDS; 86-88 DEG C;  Surgeon: Vonn VEAR Inch, MD;  Location: [facility];  Service: Gynecology;  Laterality: N/A;   POLYPECTOMY  10/05/2018   Procedure: POLYPECTOMY;  Surgeon: Shaaron Lamar HERO, MD;  Location: [facility];  Service: Endoscopy;;  colon    Prior to Admission medications   Medication Sig Start Date End Date Taking? Authorizing Provider  buPROPion (WELLBUTRIN XL) 150 MG 24 hr tablet Take 150 mg by mouth daily.   Yes [provider]  Cobalamin Combinations (B-12) [phone] MCG SUBL Place 1 tablet under the tongue daily at 12 noon.   Yes [provider]  escitalopram (LEXAPRO) 10 MG tablet Take 10 mg by mouth daily.   Yes [provider]  estradiol  (ESTRACE ) 1 MG tablet Take 1 mg by mouth daily.   Yes  [provider]  levOCARNitine (CARNITINE PO) Take 1 packet by mouth daily at 12 noon.   Yes [provider]  Na Sulfate-K Sulfate-Mg Sulfate concentrate (SUPREP) 17.5-3.13-1.6 GM/177ML SOLN As directed 11/26/23  Yes Sang Blount, Lamar HERO, MD  rosuvastatin (CRESTOR) 10 MG tablet Take 10 mg by mouth daily.   Yes [provider]  TIRZEPATIDE [place] Inject into the skin.   Yes [provider]    Allergies as of 11/26/2023 - Review Complete 10/01/2023  Allergen Reaction Noted   Demerol  [meperidine ] Other (See Comments) 10/16/2014   Prednisone  04/25/2018   Sulfa antibiotics Itching 07/13/2013    Family History  Problem Relation Age of Onset   Cancer Father    Stroke Mother     Social History   Socioeconomic History   Marital status: Significant Other    Spouse name: Not on file   Number of children: Not on file   Years of education: Not on file   Highest education level: Not on file  Occupational History   Not on file  Tobacco Use   Smoking status: Former    Current packs/day: 0.00    Average packs/day: 0.5 packs/day for 10.0 years (5.0 ttl pk-yrs)    Types: Cigarettes    Start date: 12/09/1986    Quit date: 12/08/1996    Years since quitting: 27.0   Smokeless tobacco: Never  Vaping Use   Vaping status: Never Used  Substance and Sexual Activity   Alcohol use: No  Drug use: No   Sexual activity: Not Currently    Birth control/protection: None  Other Topics Concern   Not on file  Social History Narrative   Not on file   Social Drivers of Health   Financial Resource Strain: Not on file  Food Insecurity: Not on file  Transportation Needs: Not on file  Physical Activity: Not on file  Stress: Not on file  Social Connections: Not on file  Intimate Partner Violence: Not on file    Review of Systems   See HPI, otherwise negative ROS  Physical Exam: BP 121/74   Pulse 63   Temp 97.6 F (36.4 C) (Oral)   Resp 11   Ht 5' 2 (1.575 m)    Wt 70.3 kg   LMP 04/13/2017 (Approximate)   SpO2 98%   BMI 28.35 kg/m  General:   Alert,  Well-developed, well-nourished, pleasant and cooperative in NAD Neck:  Supple; no masses or thyromegaly. No significant cervical adenopathy. Lungs:  Clear throughout to auscultation.   No wheezes, crackles, or rhonchi. No acute distress. Heart:  Regular rate and rhythm; no murmurs, clicks, rubs,  or gallops. Abdomen: Non-distended, normal bowel sounds.  Soft and nontender without appreciable mass or hepatosplenomegaly.    Impression/Plan:    54 year old lady with a positive family history of advanced adenoma and a personal history of colonic polyps here for surveillance colonoscopy. The risks, benefits, limitations, alternatives and imponderables have been reviewed with the patient. Questions have been answered. All parties are agreeable.     Notice: This dictation was prepared with Dragon dictation along with smaller phrase technology. Any transcriptional errors that result from this process are unintentional and may not be corrected upon review.

## 2024-01-04 ENCOUNTER — Encounter (HOSPITAL_COMMUNITY): Payer: Self-pay | Admitting: Internal Medicine

## 2024-01-31 ENCOUNTER — Other Ambulatory Visit: Payer: Self-pay | Admitting: Physician Assistant

## 2024-01-31 NOTE — Telephone Encounter (Unsigned)
 Copied from CRM [id number]. Topic: Clinical - Medication Refill >> Jan 31, 2024 12:05 PM Kendralyn S wrote: Medication: escitalopram (LEXAPRO) 10 MG tablet estradiol  (ESTRACE ) 1 MG tablet rosuvastatin (CRESTOR) 10 MG tablet buPROPion (WELLBUTRIN XL) 150 MG 24 hr tablet     Has the patient contacted their pharmacy? No (Agent: If no, request that the patient contact the pharmacy for the refill. If patient does not wish to contact the pharmacy document the reason why and proceed with request.) (Agent: If yes, when and what did the pharmacy advise?)  This is the patient's preferred pharmacy:  [id number] - [place], [place] - [address] AT SEC OF [address] [place] [address] Phone: [phone] Fax: [phone]  Is this the correct pharmacy for this prescription? Yes If no, delete pharmacy and type the correct one.   Has the prescription been filled recently? No  Is the patient out of the medication? No  Has the patient been seen for an appointment in the last year OR does the patient have an upcoming appointment? Yes  Can we respond through MyChart? Yes  Agent: Please be advised that Rx refills may take up to 3 business days. We ask that you follow-up with your pharmacy.

## 2024-02-02 MED ORDER — ROSUVASTATIN CALCIUM 10 MG PO TABS: 10.0000 mg | ORAL_TABLET | Freq: Every day | ORAL | 1 refills | Status: AC

## 2024-02-02 MED ORDER — ESTRADIOL 1 MG PO TABS: 1.0000 mg | ORAL_TABLET | Freq: Every day | ORAL | 1 refills | Status: AC

## 2024-02-02 MED ORDER — ESCITALOPRAM OXALATE 10 MG PO TABS: 10.0000 mg | ORAL_TABLET | Freq: Every day | ORAL | 1 refills | Status: AC

## 2024-02-02 MED ORDER — BUPROPION HCL ER (XL) 150 MG PO TB24: 150.0000 mg | ORAL_TABLET | Freq: Every day | ORAL | 1 refills | Status: AC
